# Patient Record
Sex: Female | Born: 1960 | Race: White | Hispanic: No | Smoking: Never smoker
Health system: Southern US, Community
[De-identification: ages and names within clinical notes are randomized; demographics above are authoritative.]

## PROBLEM LIST (undated history)

## (undated) DIAGNOSIS — R002 Palpitations: Secondary | ICD-10-CM

## (undated) DIAGNOSIS — K219 Gastro-esophageal reflux disease without esophagitis: Secondary | ICD-10-CM

## (undated) DIAGNOSIS — I10 Essential (primary) hypertension: Secondary | ICD-10-CM

## (undated) DIAGNOSIS — E785 Hyperlipidemia, unspecified: Secondary | ICD-10-CM

## (undated) HISTORY — DX: Gastro-esophageal reflux disease without esophagitis: K21.9

## (undated) HISTORY — DX: Hyperlipidemia, unspecified: E78.5

## (undated) HISTORY — PX: OVARIAN CYST SURGERY: SHX726

## (undated) HISTORY — DX: Palpitations: R00.2

---

## 1998-08-18 ENCOUNTER — Other Ambulatory Visit: Admission: RE | Admit: 1998-08-18 | Discharge: 1998-08-18 | Payer: Self-pay | Admitting: *Deleted

## 1999-06-09 ENCOUNTER — Emergency Department (HOSPITAL_COMMUNITY): Admission: EM | Admit: 1999-06-09 | Discharge: 1999-06-09 | Payer: Self-pay | Admitting: Emergency Medicine

## 1999-06-09 ENCOUNTER — Encounter: Payer: Self-pay | Admitting: Emergency Medicine

## 2000-05-24 ENCOUNTER — Ambulatory Visit (HOSPITAL_COMMUNITY): Admission: RE | Admit: 2000-05-24 | Discharge: 2000-05-24 | Payer: Self-pay | Admitting: Gastroenterology

## 2000-06-19 ENCOUNTER — Ambulatory Visit (HOSPITAL_BASED_OUTPATIENT_CLINIC_OR_DEPARTMENT_OTHER): Admission: RE | Admit: 2000-06-19 | Discharge: 2000-06-19 | Payer: Self-pay | Admitting: General Surgery

## 2001-07-02 ENCOUNTER — Other Ambulatory Visit: Admission: RE | Admit: 2001-07-02 | Discharge: 2001-07-02 | Payer: Self-pay | Admitting: Obstetrics & Gynecology

## 2001-08-24 ENCOUNTER — Ambulatory Visit (HOSPITAL_COMMUNITY): Admission: RE | Admit: 2001-08-24 | Discharge: 2001-08-24 | Payer: Self-pay | Admitting: Obstetrics & Gynecology

## 2001-11-16 ENCOUNTER — Ambulatory Visit (HOSPITAL_COMMUNITY): Admission: RE | Admit: 2001-11-16 | Discharge: 2001-11-16 | Payer: Self-pay | Admitting: Family Medicine

## 2002-11-27 ENCOUNTER — Other Ambulatory Visit: Admission: RE | Admit: 2002-11-27 | Discharge: 2002-11-27 | Payer: Self-pay | Admitting: Obstetrics & Gynecology

## 2003-04-30 ENCOUNTER — Ambulatory Visit (HOSPITAL_COMMUNITY): Admission: RE | Admit: 2003-04-30 | Discharge: 2003-04-30 | Payer: Self-pay | Admitting: Obstetrics & Gynecology

## 2004-11-29 ENCOUNTER — Emergency Department (HOSPITAL_COMMUNITY): Admission: EM | Admit: 2004-11-29 | Discharge: 2004-11-29 | Payer: Self-pay | Admitting: Emergency Medicine

## 2011-04-19 HISTORY — PX: COLONOSCOPY: SHX5424

## 2013-08-28 ENCOUNTER — Other Ambulatory Visit: Payer: Self-pay | Admitting: Family Medicine

## 2013-08-28 DIAGNOSIS — R110 Nausea: Secondary | ICD-10-CM

## 2013-08-28 DIAGNOSIS — R1011 Right upper quadrant pain: Secondary | ICD-10-CM

## 2013-08-28 DIAGNOSIS — R140 Abdominal distension (gaseous): Secondary | ICD-10-CM

## 2013-08-30 ENCOUNTER — Ambulatory Visit
Admission: RE | Admit: 2013-08-30 | Discharge: 2013-08-30 | Disposition: A | Discharge status: Home / Self Care | Payer: BC Managed Care – PPO | Source: Ambulatory Visit | Attending: Family Medicine | Admitting: Family Medicine

## 2013-08-30 DIAGNOSIS — R1011 Right upper quadrant pain: Secondary | ICD-10-CM

## 2013-08-30 DIAGNOSIS — R140 Abdominal distension (gaseous): Secondary | ICD-10-CM

## 2013-08-30 DIAGNOSIS — R110 Nausea: Secondary | ICD-10-CM

## 2014-10-08 ENCOUNTER — Other Ambulatory Visit: Payer: Self-pay | Admitting: Family Medicine

## 2014-10-08 DIAGNOSIS — R140 Abdominal distension (gaseous): Secondary | ICD-10-CM

## 2014-10-08 DIAGNOSIS — R1030 Lower abdominal pain, unspecified: Secondary | ICD-10-CM

## 2014-10-10 ENCOUNTER — Ambulatory Visit
Admission: RE | Admit: 2014-10-10 | Discharge: 2014-10-10 | Disposition: A | Discharge status: Home / Self Care | Payer: BLUE CROSS/BLUE SHIELD | Source: Ambulatory Visit | Attending: Family Medicine | Admitting: Family Medicine

## 2014-10-10 DIAGNOSIS — R1030 Lower abdominal pain, unspecified: Secondary | ICD-10-CM

## 2014-10-10 DIAGNOSIS — R140 Abdominal distension (gaseous): Secondary | ICD-10-CM

## 2015-05-19 IMAGING — US US ABDOMEN COMPLETE
1 series · 14 of 25 positions shown · non-contrast
Comparison: None.

CLINICAL DATA: Right upper quadrant abdominal pain. Nausea,
bloating.

EXAM:
ULTRASOUND ABDOMEN COMPLETE

[Series 1: us abdomen complete · 0.26mm/px · 14 of 75 slices shown]
[im 1/75]
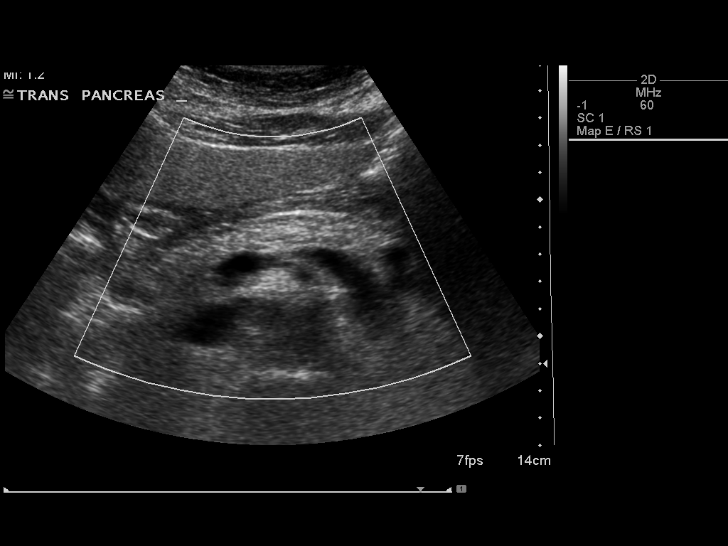
[im 7/75]
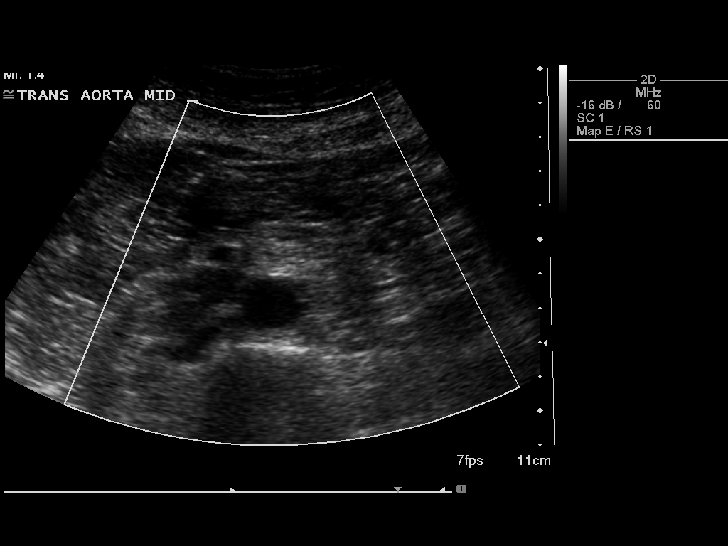
[im 13/75]
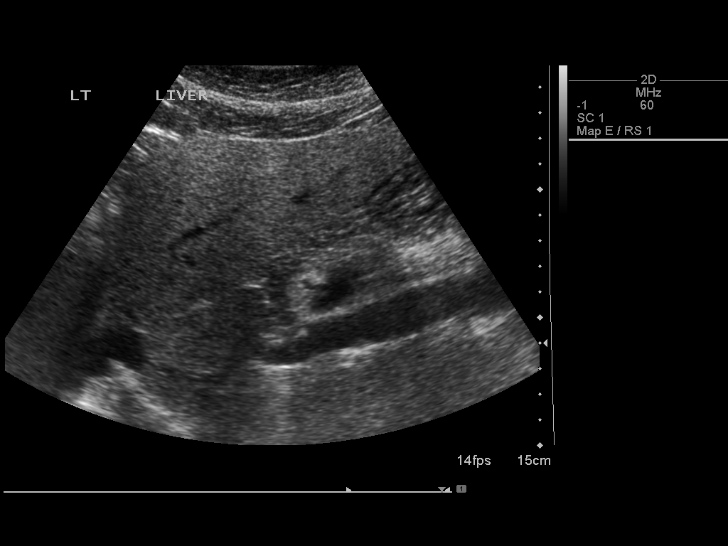
[im 19/75]
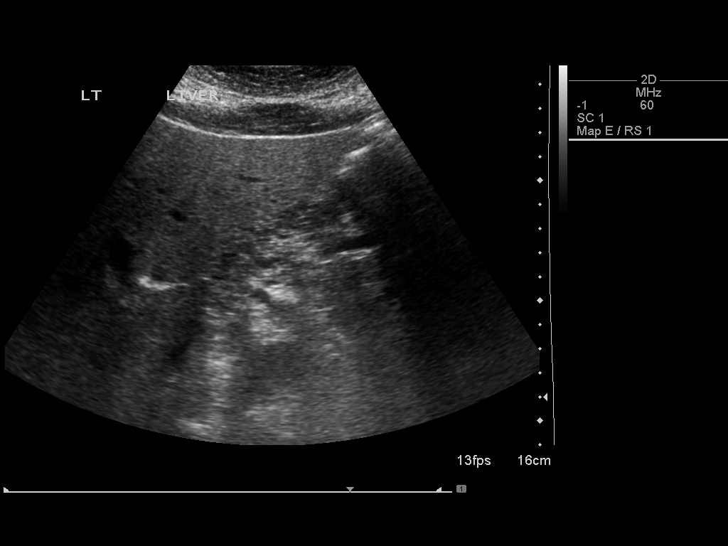
[im 25/75]
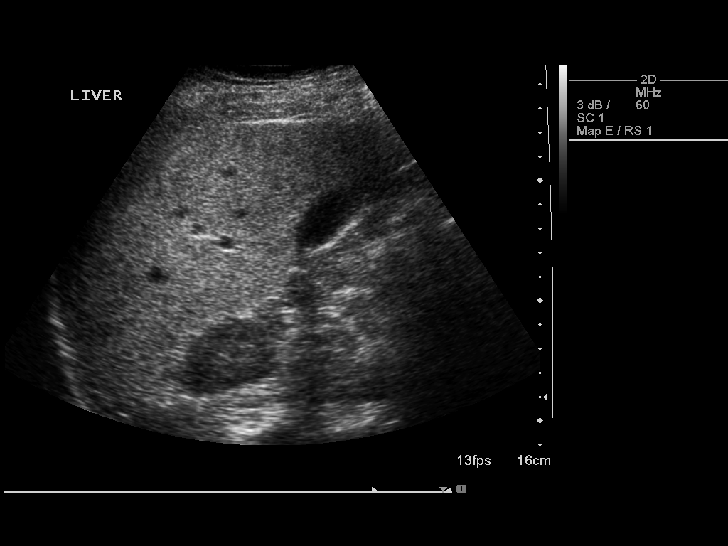
[im 28/75]
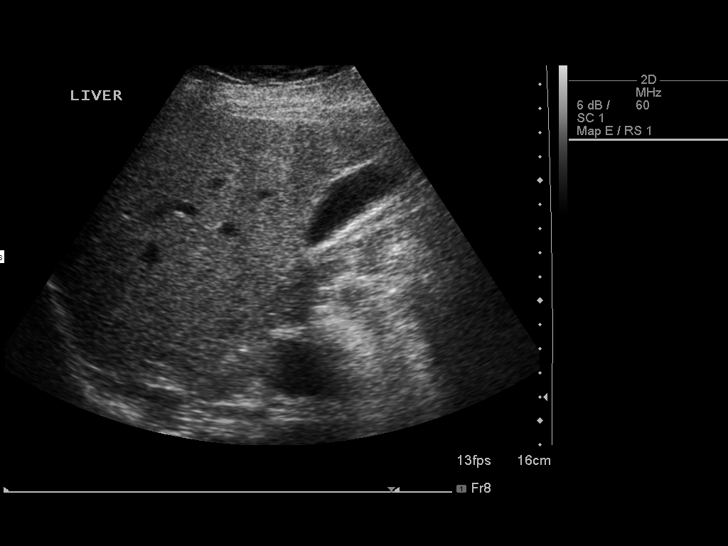
[im 34/75]
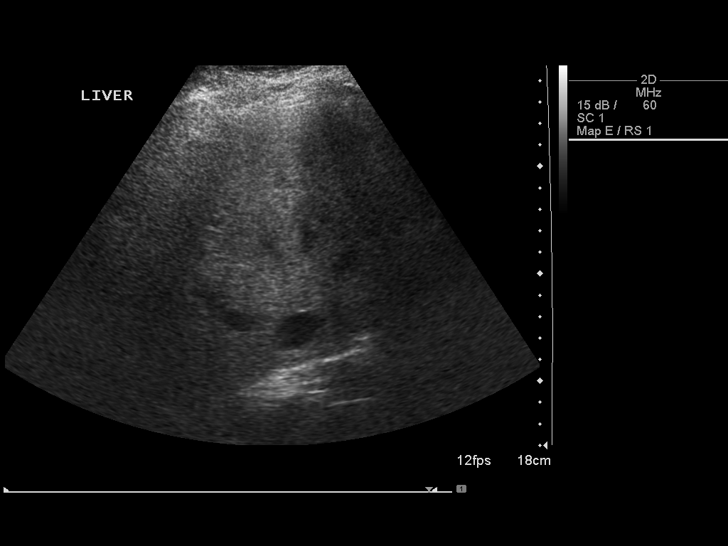
[im 41/75]
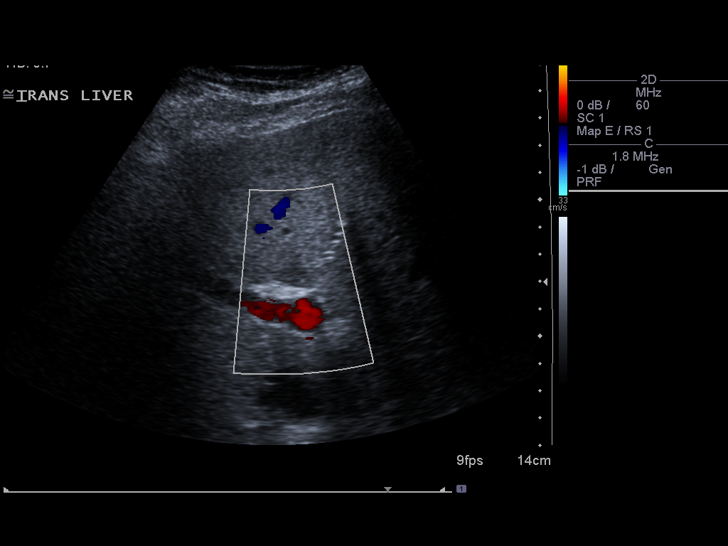
[im 47/75]
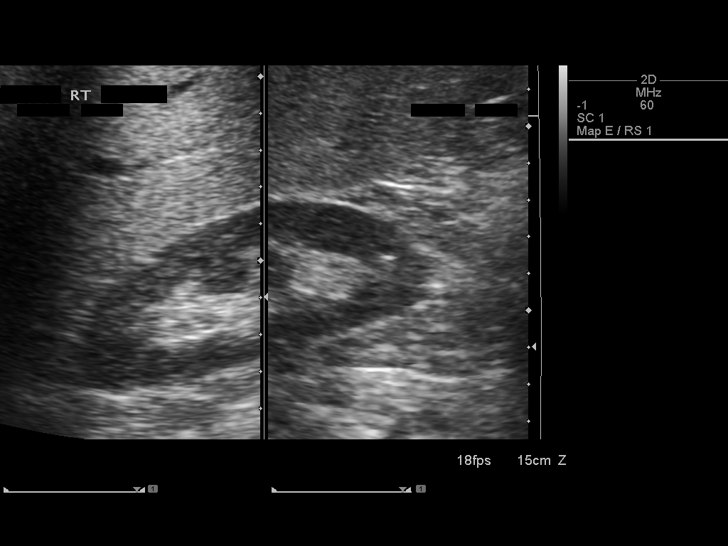
[im 50/75]
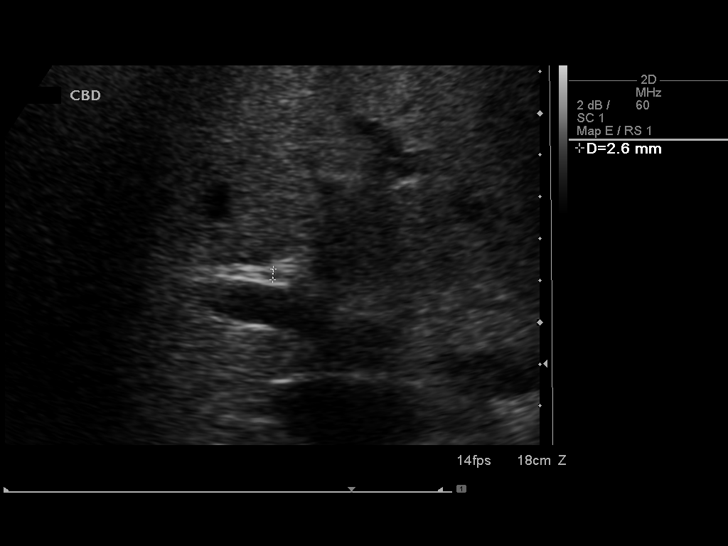
[im 56/75]
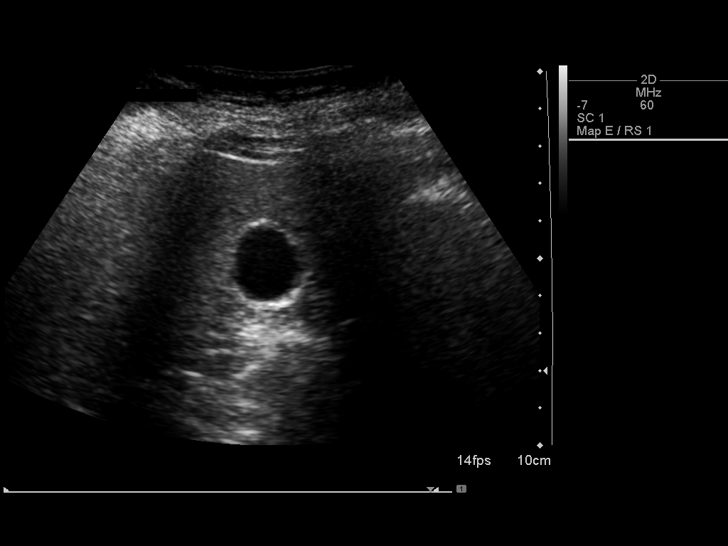
[im 62/75]
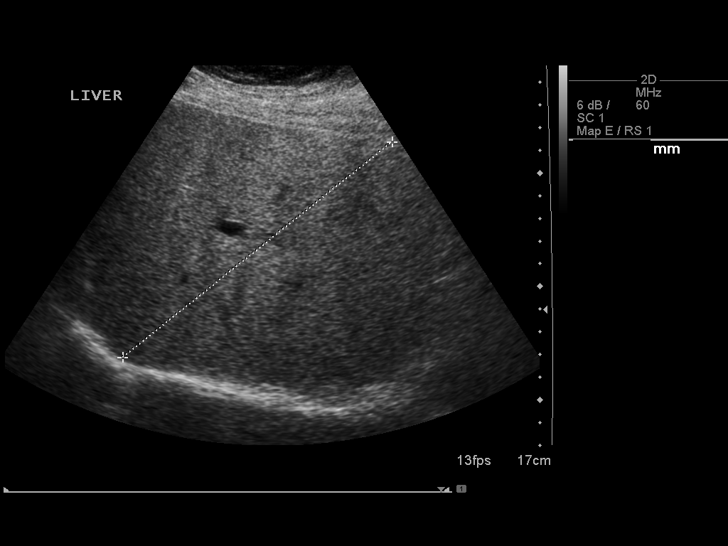
[im 68/75]
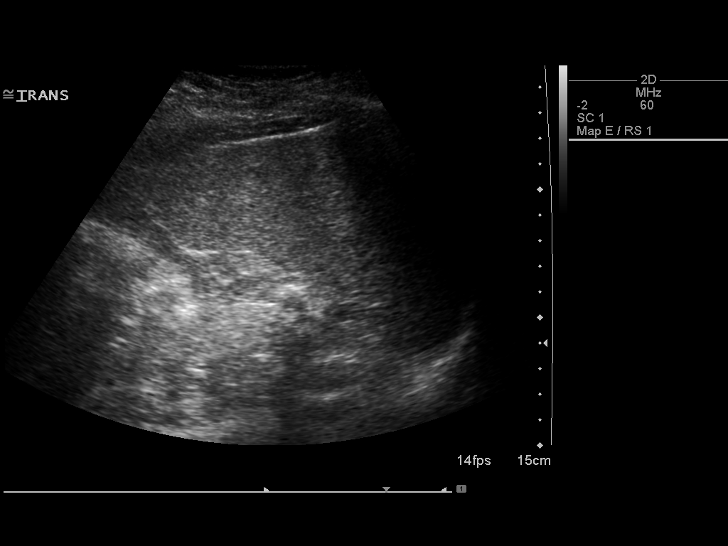
[im 75/75]
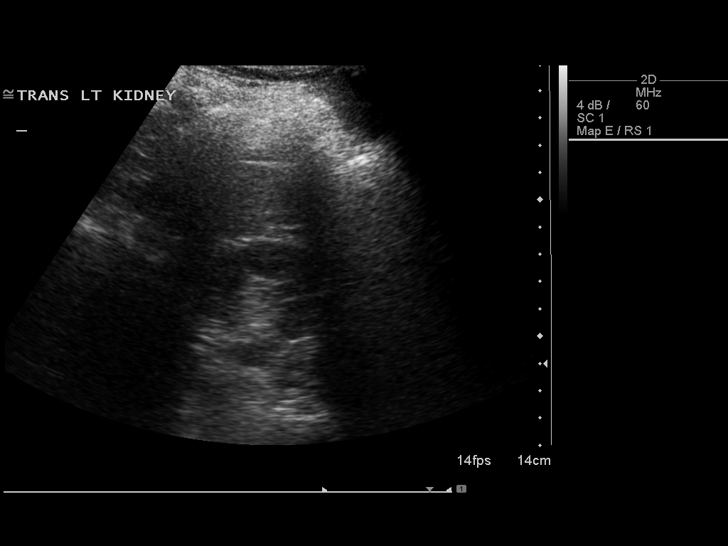

[14 of 25 positions shown; findings below may reference images not displayed]

FINDINGS: Gallbladder:

No gallstones or wall thickening visualized. No sonographic Murphy
sign noted.

Common bile duct:

Diameter: Normal caliber, 3 mm

Liver:

Increased echotexture throughout the liver suggesting fatty
infiltration. Area of hypo echogenicity adjacent to the gallbladder
fossa likely represents focal fatty sparing.

IVC:

No abnormality visualized.

Pancreas:

Visualized portion unremarkable.

Spleen:

Size and appearance within normal limits.

Right Kidney:

Length: 10.0 cm. Echogenicity within normal limits. No mass or
hydronephrosis visualized.

Left Kidney:

Length: 11.8 cm. Echogenicity within normal limits. No mass or
hydronephrosis visualized.

Abdominal aorta:

No aneurysm visualized.

Other findings:

None.
IMPRESSION: Fatty infiltration of the liver.  No acute findings.

## 2016-04-18 HISTORY — PX: UPPER GI ENDOSCOPY: SHX6162

## 2016-06-02 ENCOUNTER — Other Ambulatory Visit: Payer: Self-pay | Admitting: Obstetrics & Gynecology

## 2016-06-17 ENCOUNTER — Ambulatory Visit (HOSPITAL_COMMUNITY)
Admission: RE | Admit: 2016-06-17 | Payer: BLUE CROSS/BLUE SHIELD | Source: Ambulatory Visit | Admitting: Obstetrics & Gynecology

## 2016-06-17 ENCOUNTER — Encounter (HOSPITAL_COMMUNITY): Admission: RE | Payer: Self-pay | Source: Ambulatory Visit

## 2016-06-17 SURGERY — ROBOTIC ASSISTED TOTAL HYSTERECTOMY WITH SALPINGECTOMY
Anesthesia: General

## 2016-09-16 ENCOUNTER — Encounter: Payer: Self-pay | Admitting: Obstetrics & Gynecology

## 2017-09-18 ENCOUNTER — Encounter: Payer: Self-pay | Admitting: Obstetrics & Gynecology

## 2018-09-19 ENCOUNTER — Emergency Department (HOSPITAL_COMMUNITY): Payer: 59

## 2018-09-19 ENCOUNTER — Other Ambulatory Visit: Payer: Self-pay

## 2018-09-19 ENCOUNTER — Encounter (HOSPITAL_COMMUNITY): Payer: Self-pay | Admitting: Emergency Medicine

## 2018-09-19 ENCOUNTER — Emergency Department (HOSPITAL_COMMUNITY)
Admission: EM | Admit: 2018-09-19 | Discharge: 2018-09-19 | Disposition: A | Discharge status: Home / Self Care | Payer: 59 | Attending: Emergency Medicine | Admitting: Emergency Medicine

## 2018-09-19 DIAGNOSIS — R5383 Other fatigue: Secondary | ICD-10-CM | POA: Diagnosis not present

## 2018-09-19 DIAGNOSIS — R42 Dizziness and giddiness: Secondary | ICD-10-CM

## 2018-09-19 DIAGNOSIS — I10 Essential (primary) hypertension: Secondary | ICD-10-CM | POA: Insufficient documentation

## 2018-09-19 DIAGNOSIS — R002 Palpitations: Secondary | ICD-10-CM | POA: Insufficient documentation

## 2018-09-19 HISTORY — DX: Essential (primary) hypertension: I10

## 2018-09-19 LAB — CBC
HCT: 45.6 % (ref 36.0–46.0)
Hemoglobin: 14.8 g/dL (ref 12.0–15.0)
MCH: 31.3 pg (ref 26.0–34.0)
MCHC: 32.5 g/dL (ref 30.0–36.0)
MCV: 96.4 fL (ref 80.0–100.0)
Platelets: 132 10*3/uL — ABNORMAL LOW (ref 150–400)
RBC: 4.73 MIL/uL (ref 3.87–5.11)
RDW: 11.9 % (ref 11.5–15.5)
WBC: 3.9 10*3/uL — ABNORMAL LOW (ref 4.0–10.5)
nRBC: 0 % (ref 0.0–0.2)

## 2018-09-19 LAB — BASIC METABOLIC PANEL
Anion gap: 6 (ref 5–15)
BUN: 15 mg/dL (ref 6–20)
CO2: 26 mmol/L (ref 22–32)
Calcium: 9.6 mg/dL (ref 8.9–10.3)
Chloride: 106 mmol/L (ref 98–111)
Creatinine, Ser: 1.02 mg/dL — ABNORMAL HIGH (ref 0.44–1.00)
GFR calc Af Amer: >60 mL/min (ref >60)
GFR calc non Af Amer: >60 mL/min (ref >60)
Glucose, Bld: 92 mg/dL (ref 70–99)
Potassium: 4 mmol/L (ref 3.5–5.1)
Sodium: 138 mmol/L (ref 135–145)

## 2018-09-19 LAB — TSH: TSH: 0.907 u[IU]/mL (ref 0.350–4.500)

## 2018-09-19 LAB — D-DIMER, QUANTITATIVE: D-Dimer, Quant: <0.27 ug/mL-FEU (ref 0.00–0.50)

## 2018-09-19 LAB — TROPONIN I: Troponin I: <0.03 ng/mL (ref <0.03)

## 2018-09-19 MED ORDER — SODIUM CHLORIDE 0.9% FLUSH
3.0000 mL | Freq: Once | INTRAVENOUS | Status: AC
  Administered 2018-09-19: 3 mL via INTRAVENOUS

## 2018-09-19 MED ORDER — SODIUM CHLORIDE 0.9 % IV BOLUS
1000.0000 mL | Freq: Once | INTRAVENOUS | Status: AC
  Administered 2018-09-19: 1000 mL via INTRAVENOUS

## 2018-09-19 NOTE — ED Provider Notes (Signed)
[facility] DEPT Provider Note   CSN: [id number] Arrival date & time: 09/19/18  0941    History   Chief Complaint Chief Complaint  Patient presents with  . Palpitations    HPI Rachel Mccarty is a 58 y.o. female who presents with palpitations. PMH significant for HTN, GERD, poss IBS. She states that for the past week she's had constant palpitations like her heart is "thumping" with associated shortness of breath, fatigue, and lightheadedness. She has had these symptoms intermittently before but it's never been this persistent and usually will resolve quickly. She has never had a work up for it before. She cannot identify any specific triggers. She denies excessive caffeine use and has stopped drinking coffee to see if it helps but it has not. She denies any energy drinks, supplements, or illegal drug use. No fever, headache, syncope or near syncope, cough, abdominal pain, N/V, leg swelling.      HPI  Past Medical History:  Diagnosis Date  . Hypertension     There are no active problems to display for this patient.   History reviewed. No pertinent surgical history.   OB History   No obstetric history on file.      Home Medications    Prior to Admission medications   Not on File    Family History No family history on file.  Social History Social History   Tobacco Use  . Smoking status: Never Smoker  . Smokeless tobacco: Never Used  Substance Use Topics  . Alcohol use: Yes  . Drug use: Never     Allergies   Patient has no known allergies.   Review of Systems Review of Systems  Constitutional: Positive for fatigue. Negative for fever.  Respiratory: Positive for shortness of breath.   Cardiovascular: Positive for palpitations. Negative for chest pain and leg swelling.  Gastrointestinal: Negative for abdominal pain, nausea and vomiting.  Neurological: Positive for light-headedness. Negative for syncope.  All other systems  reviewed and are negative.    Physical Exam Updated Vital Signs BP (!) 141/99 (BP Location: Left Arm)   Pulse (!) 113   Temp 98.2 F (36.8 C) (Oral)   Ht 5\' 5"  (1.651 m)   Wt 79.4 kg   SpO2 100%   BMI 29.12 kg/m   Physical Exam Vitals signs and nursing note reviewed.  Constitutional:      General: She is not in acute distress.    Appearance: Normal appearance. She is well-developed. She is not ill-appearing.     Comments: Calm, cooperative. Pleasant  HENT:     Head: Normocephalic and atraumatic.  Eyes:     General: No scleral icterus.       Right eye: No discharge.        Left eye: No discharge.     Conjunctiva/sclera: Conjunctivae normal.     Pupils: Pupils are equal, round, and reactive to light.  Neck:     Musculoskeletal: Normal range of motion.  Cardiovascular:     Rate and Rhythm: Normal rate and regular rhythm.  Pulmonary:     Effort: Pulmonary effort is normal. No respiratory distress.     Breath sounds: Normal breath sounds.  Abdominal:     General: There is no distension.     Palpations: Abdomen is soft.     Tenderness: There is no abdominal tenderness.  Musculoskeletal:     Right lower leg: No edema.     Left lower leg: No edema.  Skin:  General: Skin is warm and dry.  Neurological:     Mental Status: She is alert and oriented to person, place, and time.  Psychiatric:        Behavior: Behavior normal.      ED Treatments / Results  Labs (all labs ordered are listed, but only abnormal results are displayed) Labs Reviewed  BASIC METABOLIC PANEL - Abnormal; Notable for the following components:      Result Value   Creatinine, Ser 1.02 (*)    All other components within normal limits  CBC - Abnormal; Notable for the following components:   WBC 3.9 (*)    Platelets 132 (*)    All other components within normal limits  TROPONIN I  TSH  D-DIMER, QUANTITATIVE (NOT AT [facility])    EKG EKG Interpretation  Date/Time:  Wednesday September 19 2018  09:52:58 EDT Ventricular Rate:  117 PR Interval:    QRS Duration: 85 QT Interval:  325 QTC Calculation: 454 R Axis:   13 Text Interpretation:  Sinus tachycardia Low voltage, precordial leads No acute changes No significant change since last tracing [id number] No significant change since last tracing Confirmed by Varney Biles [phone]) on 09/19/2018 11:58:12 AM   Radiology Dg Chest 2 View  Result Date: 09/19/2018 CLINICAL DATA:  Heart palpitations 1.5 weeks with chest pain EXAM: CHEST - 2 VIEW COMPARISON:  None available FINDINGS: Normal heart size and mediastinal contours. No acute infiltrate or edema. No effusion or pneumothorax. Exaggerated thoracic kyphosis. No acute osseous findings. IMPRESSION: Negative chest. Electronically Signed   By: Monte Fantasia M.D.   On: 09/19/2018 10:25    Procedures Procedures (including critical care time)  Medications Ordered in ED Medications  sodium chloride flush (NS) 0.9 % injection 3 mL (3 mLs Intravenous Given 09/19/18 1055)  sodium chloride 0.9 % bolus 1,000 mL (0 mLs Intravenous Stopped 09/19/18 1413)     Initial Impression / Assessment and Plan / ED Course  I have reviewed the triage vital signs and the nursing notes.  Pertinent labs & imaging results that were available during my care of the patient were reviewed by me and considered in my medical decision making (see chart for details).  58 year old female presents with palpitations, SOB, lightheadedness, fatigue. Symptoms have been ongoing for a week. Unclear etiology. She is mildly tachycardic but otherwise vitals are normal. Exam is unremarkable. EKG is sinus tachycardia. Will CXR, labs, TSH, D-dimer.   CBC is normal. BMP is normal. TSH is normal. D-dimer is negative. Trop is normal. Do not think she needs a repeat since this was ordered in triage per CP order set and she is not having symptoms consistent with ACS. She was given 1L fluid and feels better. HR is improved to mid 80s. She has an  upcoming appt with Cardiology in mid June. She was advised to drink plenty of fluids. She is comfortable with plan.   Final Clinical Impressions(s) / ED Diagnoses   Final diagnoses:  Palpitations  Lightheaded  Fatigue, unspecified type    ED Discharge Orders    None       Recardo Evangelist, PA-C 09/19/18 [place], MD 09/20/18 [phone]

## 2018-09-19 NOTE — Discharge Instructions (Signed)
Please drink plenty of fluids and follow up with your doctor Return if worsening

## 2018-09-19 NOTE — ED Triage Notes (Signed)
Pt complaint of palpitations for past week and a half. "sometimes left chest pain;" denies at present "but weak and lightheaded."

## 2018-09-20 ENCOUNTER — Ambulatory Visit: Payer: 59

## 2018-09-20 ENCOUNTER — Encounter: Payer: Self-pay | Admitting: Cardiology

## 2018-09-20 ENCOUNTER — Ambulatory Visit: Payer: 59 | Admitting: Cardiology

## 2018-09-20 VITALS — BP 136/83 | HR 108 | Temp 97.8°F | Ht 65.0 in | Wt 180.0 lb

## 2018-09-20 DIAGNOSIS — R000 Tachycardia, unspecified: Secondary | ICD-10-CM | POA: Diagnosis not present

## 2018-09-20 DIAGNOSIS — I10 Essential (primary) hypertension: Secondary | ICD-10-CM | POA: Diagnosis not present

## 2018-09-20 DIAGNOSIS — E782 Mixed hyperlipidemia: Secondary | ICD-10-CM

## 2018-09-20 DIAGNOSIS — R002 Palpitations: Secondary | ICD-10-CM | POA: Insufficient documentation

## 2018-09-20 MED ORDER — ATENOLOL 50 MG PO TABS: 50.0000 mg | ORAL_TABLET | Freq: Two times a day (BID) | ORAL | 1 refills | Status: DC

## 2018-09-20 NOTE — Progress Notes (Signed)
Primary Physician:  Aletha Halim., PA-C   Patient ID: Rachel Mccarty, female    DOB: April 04, 1961, 58 y.o.   MRN: [id number]  Subjective:    Chief Complaint  Patient presents with  . Palpitations    palpitations 2 weeks, SOB  . New Patient (Initial Visit)    HPI: Rachel Mccarty  is a 58 y.o. female  with hypertension, hyperlipidemia, history of elevated liver enzymes (now normal), presents today for evaluation of palpitations.   She was seen in ER on 06/03 for persistent palpitations that are associated with shortness of breath, fatigue, and lightheadedness. She has previously had palpitations, but not persistent like she has had this past week. Workup in the ER was unyielding. Negative troponin, CXR was normal, EKG revealed sinus tachycardia. HR improved with IVF. She was advised outpatient follow up. She called our office today asking to be seen on urgent basis as she was again having worsening palpitations and felt short of breath.   States that her heart rate has been elevated, persistently in the low 100's. Denies any chest pain or shortness of breath. No syncope. She was previously walking regularly that she tolerated well. Lipids were noted to be elevated at her last physical and she is working to make diet changes.   Maternal grandmother had MI at age 44. Mother died at age 46 from sudden cardiac death. No former tobacco use. She does drink one beer per day, but has not drank any alcohol over the last week.   Past Medical History:  Diagnosis Date  . Hypertension   . Palpitations     Past Surgical History:  Procedure Laterality Date  . OVARIAN CYST SURGERY      Social History   Socioeconomic History  . Marital status: Married    Spouse name: Not on file  . Number of children: 2  . Years of education: Not on file  . Highest education level: Not on file  Occupational History  . Not on file  Social Needs  . Financial resource strain: Not on file  . Food  insecurity:    Worry: Not on file    Inability: Not on file  . Transportation needs:    Medical: Not on file    Non-medical: Not on file  Tobacco Use  . Smoking status: Never Smoker  . Smokeless tobacco: Never Used  Substance and Sexual Activity  . Alcohol use: Yes    Alcohol/week: 1.0 standard drinks    Types: 1 Cans of beer per week    Comment: 1 drink per day  . Drug use: Never  . Sexual activity: Not on file  Lifestyle  . Physical activity:    Days per week: Not on file    Minutes per session: Not on file  . Stress: Not on file  Relationships  . Social connections:    Talks on phone: Not on file    Gets together: Not on file    Attends religious service: Not on file    Active member of club or organization: Not on file    Attends meetings of clubs or organizations: Not on file    Relationship status: Not on file  . Intimate partner violence:    Fear of current or ex partner: Not on file    Emotionally abused: Not on file    Physically abused: Not on file    Forced sexual activity: Not on file  Other Topics Concern  . Not on  file  Social History Narrative  . Not on file    Review of Systems  Constitution: Negative for decreased appetite, malaise/fatigue, weight gain and weight loss.  Eyes: Negative for visual disturbance.  Cardiovascular: Positive for leg swelling (varicose veins) and palpitations. Negative for chest pain, claudication, dyspnea on exertion, orthopnea and syncope.  Respiratory: Negative for hemoptysis and wheezing.   Endocrine: Negative for cold intolerance and heat intolerance.  Hematologic/Lymphatic: Does not bruise/bleed easily.  Skin: Negative for nail changes.  Musculoskeletal: Negative for muscle weakness and myalgias.  Gastrointestinal: Negative for abdominal pain, change in bowel habit, nausea and vomiting.  Neurological: Positive for dizziness. Negative for difficulty with concentration, focal weakness and headaches.   Psychiatric/Behavioral: Negative for altered mental status and suicidal ideas.  All other systems reviewed and are negative.     Objective:  Blood pressure 136/83, pulse (!) 108, temperature 97.8 F (36.6 C), height '5\' 5"'  (1.651 m), weight 180 lb (81.6 kg), SpO2 95 %. Body mass index is 29.95 kg/m.    Physical Exam  Constitutional: She is oriented to person, place, and time. Vital signs are normal. She appears well-developed and well-nourished.  HENT:  Head: Normocephalic and atraumatic.  Neck: Normal range of motion.  Cardiovascular: Normal rate, regular rhythm, normal heart sounds and intact distal pulses.  Pulmonary/Chest: Effort normal and breath sounds normal. No accessory muscle usage. No respiratory distress.  Abdominal: Soft. Bowel sounds are normal.  Musculoskeletal: Normal range of motion.  Neurological: She is alert and oriented to person, place, and time.  Skin: Skin is warm and dry.  Vitals reviewed.  Radiology: Dg Chest 2 View  Result Date: 09/19/2018 CLINICAL DATA:  Heart palpitations 1.5 weeks with chest pain EXAM: CHEST - 2 VIEW COMPARISON:  None available FINDINGS: Normal heart size and mediastinal contours. No acute infiltrate or edema. No effusion or pneumothorax. Exaggerated thoracic kyphosis. No acute osseous findings. IMPRESSION: Negative chest. Electronically Signed   By: Monte Fantasia M.D.   On: 09/19/2018 10:25    Laboratory examination:   06/28/2018: Cholesterol 228, triglycerides 125, HDL 56, LDL 157. K 4.0, Creatinine 1.05, eGFR 59, normal liver enzymes, CMP normal.  CMP Latest Ref Rng & Units 09/19/2018  Glucose 70 - 99 mg/dL 92  BUN 6 - 20 mg/dL 15  Creatinine 0.44 - 1.00 mg/dL 1.02(H)  Sodium 135 - 145 mmol/L 138  Potassium 3.5 - 5.1 mmol/L 4.0  Chloride 98 - 111 mmol/L 106  CO2 22 - 32 mmol/L 26  Calcium 8.9 - 10.3 mg/dL 9.6   CBC Latest Ref Rng & Units 09/19/2018  WBC 4.0 - 10.5 K/uL 3.9(L)  Hemoglobin 12.0 - 15.0 g/dL 14.8  Hematocrit 36.0  - 46.0 % 45.6  Platelets 150 - 400 K/uL 132(L)   Lipid Panel  No results found for: CHOL, TRIG, HDL, CHOLHDL, VLDL, LDLCALC, LDLDIRECT HEMOGLOBIN A1C No results found for: HGBA1C, MPG TSH Recent Labs    09/19/18 1057  TSH 0.907    PRN Meds:. Medications Discontinued During This Encounter  Medication Reason  . lisinopril (ZESTRIL) 20 MG tablet Discontinued by provider   Current Meds  Medication Sig  . Multiple Vitamin tablet Take 1 tablet by mouth daily.  Marland Kitchen omeprazole (PRILOSEC) 40 MG capsule Take 40 mg by mouth daily.  . [DISCONTINUED] lisinopril (ZESTRIL) 20 MG tablet Take 20 mg by mouth daily.    Cardiac Studies:     Assessment:   Sinus tachycardia - Plan: EKG 12-Lead, Holter monitor - 48 hour, PCV ECHOCARDIOGRAM  COMPLETE  Essential hypertension - Plan: Holter monitor - 48 hour, PCV ECHOCARDIOGRAM COMPLETE  Mixed hyperlipidemia  EKG 09/20/2018: Sinus tachycardia at 101 bpm, normal axis, no evidence of ischemia.   Recommendations:   Patient noted to have sinus tachycardia on EKG with occasional PVC/PAC on exam. She is without chest pain or dyspnea on exertion. She is having associated shortness of breath, dizziness, and fatigue with persistent tachycardia. Unsure of etiology for sudden onset of tachycardia. EKG workup was reassuring. Troponin negative and D-Dimer negative. Physical exam is unremarkable. Although TSH was normal, could have subclinical hyperthyroidism. I will discontinue her lisinopril and change to atenolol 50 mg BID. Will place her on 48 hour holter monitor for further evaluation. Will also obtain echocardiogram to exclude any structural abnormalities. She is working to make diet changes to control her lipids, would recommend reevaluation in the next few months. I will see her back in 6 weeks for follow up or sooner if needed.   *I have discussed this case with Dr. Einar Gip and he personally examined the patient and participated in formulating the plan.*     Miquel Dunn, MSN, APRN, FNP-C [facility] [place] Office: [phone] Fax: [phone]

## 2018-09-24 ENCOUNTER — Telehealth: Payer: Self-pay

## 2018-09-24 NOTE — Telephone Encounter (Signed)
Pt had a reaction to the Atenolol, severe diarreah and SOB. Can you recommend something else.

## 2018-09-24 NOTE — Telephone Encounter (Signed)
Unsure if related to medication, would recommend holding it for a few days and rechallenge to see if it reoccurs again. She can even try taking half of the previous dose when she rechallenges to see how she does.

## 2018-09-24 NOTE — Telephone Encounter (Signed)
Pt agreed and will let us know how she does.

## 2018-09-25 DIAGNOSIS — R002 Palpitations: Secondary | ICD-10-CM

## 2018-09-26 ENCOUNTER — Telehealth: Payer: Self-pay

## 2018-09-26 NOTE — Telephone Encounter (Signed)
Can we get a copy of that? She needs nuclear stress test

## 2018-09-26 NOTE — Telephone Encounter (Signed)
On your desk

## 2018-09-26 NOTE — Telephone Encounter (Signed)
Rachel Mccarty from [facility] called with report: 17 beat run of v-tac @ 164 BPM

## 2018-09-27 NOTE — Telephone Encounter (Signed)
LVM to return call to discuss holter report

## 2018-09-28 ENCOUNTER — Other Ambulatory Visit: Payer: Self-pay | Admitting: Cardiology

## 2018-10-01 ENCOUNTER — Ambulatory Visit: Payer: Self-pay | Admitting: Cardiology

## 2018-10-02 ENCOUNTER — Other Ambulatory Visit: Payer: Self-pay | Admitting: Cardiology

## 2018-10-02 DIAGNOSIS — I472 Ventricular tachycardia: Secondary | ICD-10-CM

## 2018-10-02 DIAGNOSIS — I4729 Other ventricular tachycardia: Secondary | ICD-10-CM

## 2018-10-02 NOTE — Progress Notes (Signed)
Called patient and discussed holter report findings with 17 beats of NSVT. I have recommended nuclear stress testing for further evaluation. She is now taking a half a tablet of atenolol and is feeling some better. Does report some heart racing episodes on occasion and also episodes of feeling very fatigued and short of breath.

## 2018-10-08 ENCOUNTER — Ambulatory Visit (INDEPENDENT_AMBULATORY_CARE_PROVIDER_SITE_OTHER): Payer: 59

## 2018-10-08 ENCOUNTER — Other Ambulatory Visit: Payer: Self-pay

## 2018-10-08 DIAGNOSIS — I472 Ventricular tachycardia: Secondary | ICD-10-CM

## 2018-10-08 DIAGNOSIS — I4729 Other ventricular tachycardia: Secondary | ICD-10-CM

## 2018-10-10 ENCOUNTER — Telehealth: Payer: Self-pay | Admitting: Cardiology

## 2018-10-10 NOTE — Telephone Encounter (Signed)
LVM making patient aware of low risk stress test. Will further discuss at follow up. Continue with echo as scheduled.

## 2018-10-23 ENCOUNTER — Ambulatory Visit (INDEPENDENT_AMBULATORY_CARE_PROVIDER_SITE_OTHER): Payer: 59

## 2018-10-23 ENCOUNTER — Other Ambulatory Visit: Payer: Self-pay

## 2018-10-23 DIAGNOSIS — R000 Tachycardia, unspecified: Secondary | ICD-10-CM | POA: Diagnosis not present

## 2018-10-23 DIAGNOSIS — I10 Essential (primary) hypertension: Secondary | ICD-10-CM

## 2018-10-30 ENCOUNTER — Ambulatory Visit (INDEPENDENT_AMBULATORY_CARE_PROVIDER_SITE_OTHER): Payer: 59 | Admitting: Cardiology

## 2018-10-30 ENCOUNTER — Other Ambulatory Visit: Payer: Self-pay

## 2018-10-30 ENCOUNTER — Encounter: Payer: Self-pay | Admitting: Cardiology

## 2018-10-30 VITALS — Ht 65.0 in | Wt 165.0 lb

## 2018-10-30 DIAGNOSIS — R000 Tachycardia, unspecified: Secondary | ICD-10-CM | POA: Diagnosis not present

## 2018-10-30 DIAGNOSIS — I10 Essential (primary) hypertension: Secondary | ICD-10-CM | POA: Diagnosis not present

## 2018-10-30 DIAGNOSIS — I4729 Other ventricular tachycardia: Secondary | ICD-10-CM

## 2018-10-30 DIAGNOSIS — E782 Mixed hyperlipidemia: Secondary | ICD-10-CM

## 2018-10-30 DIAGNOSIS — I472 Ventricular tachycardia: Secondary | ICD-10-CM

## 2018-10-30 MED ORDER — ATENOLOL 25 MG PO TABS: 25.0000 mg | ORAL_TABLET | Freq: Two times a day (BID) | ORAL | 0 refills | Status: DC

## 2018-10-30 NOTE — Progress Notes (Signed)
Primary Physician:  Aletha Halim., PA-C   Patient ID: Rachel Mccarty, female    DOB: 03-Aug-1960, 58 y.o.   MRN: [id number]  Subjective:    Chief Complaint  Patient presents with  . Tachycardia  . Follow-up    echo results   This visit type was conducted due to national recommendations for restrictions regarding the COVID-19 Pandemic (e.g. social distancing).  This format is felt to be most appropriate for this patient at this time.  All issues noted in this document were discussed and addressed.  No physical exam was performed (except for noted visual exam findings with Telehealth visits).  The patient has consented to conduct a Telehealth visit and understands insurance will be billed.   I discussed the limitations of evaluation and management by telemedicine and the availability of in person appointments. The patient expressed understanding and agreed to proceed.  Virtual Visit via Video Note is as below  I connected with Ms. Finau, on 10/30/18 at 0900 by a video enabled telemedicine application and verified that I am speaking with the correct person using two identifiers.     I have discussed with her regarding the safety during COVID Pandemic and steps and precautions including social distancing with the patient.    HPI: Rachel Mccarty  is a 58 y.o. female  with hypertension, hyperlipidemia, history of elevated liver enzymes (now normal),recently seen for sinus tachycardia and palpitations.  She was seen in ER on 06/03 for persistent palpitations that are associated with shortness of breath, fatigue, and lightheadedness. She has previously had palpitations, but not persistent. Workup in the ER was unyielding. She was started on atenolol and was placed on 48 hour holter monitor that revealed sinus tachycardia and 17 beats of NSVT. She then was scheduled for lexiscan nuclear stress test and echocardiogram and now presents for follow up.  Since bing on atenolol, heart rate and  palpitations have improved.    Denies any chest pain or shortness of breath. No syncope. She was previously walking regularly that she tolerated well. Lipids were noted to be elevated at her last physical and she is working to make diet changes.   Maternal grandmother had MI at age 20. Mother died at age 45 from sudden cardiac death. No former tobacco use. She does drink one beer per day, but has not drank any alcohol over the last week.   Past Medical History:  Diagnosis Date  . Hypertension   . Palpitations     Past Surgical History:  Procedure Laterality Date  . OVARIAN CYST SURGERY      Social History   Socioeconomic History  . Marital status: Married    Spouse name: Not on file  . Number of children: 2  . Years of education: Not on file  . Highest education level: Not on file  Occupational History  . Not on file  Social Needs  . Financial resource strain: Not on file  . Food insecurity    Worry: Not on file    Inability: Not on file  . Transportation needs    Medical: Not on file    Non-medical: Not on file  Tobacco Use  . Smoking status: Never Smoker  . Smokeless tobacco: Never Used  Substance and Sexual Activity  . Alcohol use: Yes    Alcohol/week: 1.0 standard drinks    Types: 1 Cans of beer per week    Comment: 1 drink per day  . Drug use: Never  .  Sexual activity: Not on file  Lifestyle  . Physical activity    Days per week: Not on file    Minutes per session: Not on file  . Stress: Not on file  Relationships  . Social Herbalist on phone: Not on file    Gets together: Not on file    Attends religious service: Not on file    Active member of club or organization: Not on file    Attends meetings of clubs or organizations: Not on file    Relationship status: Not on file  . Intimate partner violence    Fear of current or ex partner: Not on file    Emotionally abused: Not on file    Physically abused: Not on file    Forced sexual activity:  Not on file  Other Topics Concern  . Not on file  Social History Narrative  . Not on file    Review of Systems  Constitution: Negative for decreased appetite, malaise/fatigue, weight gain and weight loss.  Eyes: Negative for visual disturbance.  Cardiovascular: Positive for leg swelling (varicose veins). Negative for chest pain, claudication, dyspnea on exertion, orthopnea, palpitations and syncope.  Respiratory: Negative for hemoptysis and wheezing.   Endocrine: Negative for cold intolerance and heat intolerance.  Hematologic/Lymphatic: Does not bruise/bleed easily.  Skin: Negative for nail changes.  Musculoskeletal: Negative for muscle weakness and myalgias.  Gastrointestinal: Negative for abdominal pain, change in bowel habit, nausea and vomiting.  Neurological: Negative for difficulty with concentration, dizziness, focal weakness and headaches.  Psychiatric/Behavioral: Negative for altered mental status and suicidal ideas.  All other systems reviewed and are negative.     Objective:  Height '5\' 5"'  (1.651 m), weight 165 lb (74.8 kg). Body mass index is 27.46 kg/m.    Physical exam not performed or limited due to virtual visit.  Patient appeared to be in no distress, Neck was supple, respiration was not labored.  Please see exam details from prior visit is as below.   Physical Exam  Constitutional: She is oriented to person, place, and time. Vital signs are normal. She appears well-developed and well-nourished.  HENT:  Head: Normocephalic and atraumatic.  Neck: Normal range of motion.  Cardiovascular: Normal rate, regular rhythm, normal heart sounds and intact distal pulses.  Pulmonary/Chest: Effort normal and breath sounds normal. No accessory muscle usage. No respiratory distress.  Abdominal: Soft. Bowel sounds are normal.  Musculoskeletal: Normal range of motion.  Neurological: She is alert and oriented to person, place, and time.  Skin: Skin is warm and dry.  Vitals  reviewed.  Radiology: No results found.  Laboratory examination:   06/28/2018: Cholesterol 228, triglycerides 125, HDL 56, LDL 157. K 4.0, Creatinine 1.05, eGFR 59, normal liver enzymes, CMP normal.   CMP Latest Ref Rng & Units 09/19/2018  Glucose 70 - 99 mg/dL 92  BUN 6 - 20 mg/dL 15  Creatinine 0.44 - 1.00 mg/dL 1.02(H)  Sodium 135 - 145 mmol/L 138  Potassium 3.5 - 5.1 mmol/L 4.0  Chloride 98 - 111 mmol/L 106  CO2 22 - 32 mmol/L 26  Calcium 8.9 - 10.3 mg/dL 9.6   CBC Latest Ref Rng & Units 09/19/2018  WBC 4.0 - 10.5 K/uL 3.9(L)  Hemoglobin 12.0 - 15.0 g/dL 14.8  Hematocrit 36.0 - 46.0 % 45.6  Platelets 150 - 400 K/uL 132(L)   Lipid Panel  No results found for: CHOL, TRIG, HDL, CHOLHDL, VLDL, LDLCALC, LDLDIRECT HEMOGLOBIN A1C No results found for: HGBA1C,  MPG TSH Recent Labs    09/19/18 1057  TSH 0.907    PRN Meds:. There are no discontinued medications. Current Meds  Medication Sig  . atenolol (TENORMIN) 50 MG tablet TAKE 1 TABLET BY MOUTH TWICE A DAY  . Multiple Vitamin tablet Take 1 tablet by mouth daily.  Marland Kitchen omeprazole (PRILOSEC) 40 MG capsule Take 40 mg by mouth daily.    Cardiac Studies:   Echocardiogram 10/23/2018: Normal LV systolic function with EF 56%. Left ventricle cavity is normal in size. Normal left ventricular wall thickness. Normal global wall motion. Normal diastolic filling pattern. Trileaflet aortic valve. Mild (Grade I) aortic regurgitation. Moderate (Grade II) mitral regurgitation. Mild tricuspid regurgitation. Estimated pulmonary artery systolic pressure is 31 mmHg.   Lexiscan Sestamibi Stress Test 10/08/2018: Stress EKG is non-diagnostic, as this is pharmacological stress test. Myocardial pefusion imaging is normal. All segments of left ventricle demonstrated normal wall motion and thickening.  Stress LV EF is calculated at 44% but visually appears normal.   Low risk study.  48 hr holter 09/20/2018: NSR/sinus tachycardia. 9 hrs 47 mins of  tachycardia. Rare PAC and PVC with rare atrial couplet. Symptoms of shortness of breath, rapid HR correlated with 1 PVC and sinus tachycardia. Also had symptoms of shortness of breath with NSR. 2 episodes of atrial tachycardia without reported symptoms. 1 episode of NSVT for 17 beats on D2 at 1452 without symptoms. No A fib or SVT was noted.   Assessment:     ICD-10-CM   1. NSVT (nonsustained ventricular tachycardia) (HCC)  I47.2 Thyroid Profile  2. Sinus tachycardia  R00.0 Thyroid Profile  3. Essential hypertension  I10   4. Mixed hyperlipidemia  E78.2     EKG 09/20/2018: Sinus tachycardia at 101 bpm, normal axis, no evidence of ischemia.   Recommendations:   Patient is presently doing well, has had improvement in palpitations, dizziness, and shortness of breath since being on atenolol.  She did have 2 decrease her dose of atenolol down to 25 mg twice daily due to worsening shortness of breath, but since doing this has felt much better.  She does continue to have some days of worsening palpitations.  Had asymptomatic NSVT noted on event monitor.  Has had negative nuclear stress test and essentially normal echocardiogram.  Test results were discussed with the patient.  I discussed etiology for NSVT and is likely self-limiting.  Would recommend continuing with beta-blocker therapy.    She does mention worsening leg swelling since being on the medicine.  I do not suspect is related to medication as generally this is not a side effect of beta-blockers; however, if symptoms continue can consider changing to different medication.  I have ordered full thyroid panel to exclude any hyperthyroidism as etiology for her sudden onset of sinus tachycardia and palpitations.  She does have markedly elevated lipids that will need reevaluation in the next few months with her PCP.  Would like to see her back in 2 months for follow-up.  She did not have a way to check her blood pressure or heart rate today, will  schedule for next follow-up appointment to be in office for further evaluation.    Miquel Dunn, MSN, APRN, FNP-C [facility]. [place] Office: [phone] Fax: [phone]

## 2018-12-17 ENCOUNTER — Other Ambulatory Visit: Payer: Self-pay | Admitting: Cardiology

## 2018-12-18 LAB — THYROID PANEL
Free Thyroxine Index: 1.8 (ref 1.2–4.9)
T3 Uptake Ratio: 25 % (ref 24–39)
T4, Total: 7.3 ug/dL (ref 4.5–12.0)

## 2018-12-20 NOTE — Progress Notes (Signed)
Lvm for pt to call back. 

## 2018-12-20 NOTE — Progress Notes (Signed)
Thyroid profile within normal limits

## 2019-01-03 ENCOUNTER — Encounter: Payer: Self-pay | Admitting: Cardiology

## 2019-01-03 ENCOUNTER — Ambulatory Visit (INDEPENDENT_AMBULATORY_CARE_PROVIDER_SITE_OTHER): Payer: 59 | Admitting: Cardiology

## 2019-01-03 ENCOUNTER — Other Ambulatory Visit: Payer: Self-pay

## 2019-01-03 VITALS — BP 130/88 | HR 70 | Ht 65.0 in | Wt 182.0 lb

## 2019-01-03 DIAGNOSIS — I10 Essential (primary) hypertension: Secondary | ICD-10-CM

## 2019-01-03 DIAGNOSIS — I472 Ventricular tachycardia: Secondary | ICD-10-CM

## 2019-01-03 DIAGNOSIS — I4729 Other ventricular tachycardia: Secondary | ICD-10-CM

## 2019-01-03 DIAGNOSIS — E782 Mixed hyperlipidemia: Secondary | ICD-10-CM

## 2019-01-03 DIAGNOSIS — R000 Tachycardia, unspecified: Secondary | ICD-10-CM

## 2019-01-03 DIAGNOSIS — I340 Nonrheumatic mitral (valve) insufficiency: Secondary | ICD-10-CM

## 2019-01-03 NOTE — Progress Notes (Signed)
Primary Physician:  Aletha Halim., PA-C   Patient ID: Rachel Mccarty, female    DOB: [birth year], 58 y.o.   MRN: [id number]  Subjective:    No chief complaint on file.   HPI: Rachel Mccarty  is a 58 y.o. female  with hypertension, hyperlipidemia, history of elevated liver enzymes (now normal),recently seen for sinus tachycardia and palpitations.  She was seen in ER on 06/03 for persistent palpitations that are associated with shortness of breath, fatigue, and lightheadedness. She has previously had palpitations, but not persistent. Workup in the ER was unyielding. She was started on atenolol and was placed on 48 hour holter monitor that revealed sinus tachycardia and 17 beats of NSVT.  Underwent Lexiscan nuclear stress test on 10/08/2018 that was considered low risk study.  Echocardiogram also performed at that time showed normal LVEF with moderate MR.    Since bing on atenolol, heart rate and palpitations have improved.  She now presents for 85-monthfollow-up.  Symptoms have continued to improve and she is feeling much better.  No complaints today.   Denies any chest pain or shortness of breath. No syncope.  She has been making diet changes and has also started regular exercise that she is tolerating well.  She has not had lipids rechecked.  Maternal grandmother had MI at age 58 Mother died at age 1247from sudden cardiac death. No former tobacco use. She does drink one beer per day, but has not drank any alcohol over the last week.   Past Medical History:  Diagnosis Date  . Hypertension   . Palpitations     Past Surgical History:  Procedure Laterality Date  . OVARIAN CYST SURGERY      Social History   Socioeconomic History  . Marital status: Married    Spouse name: Not on file  . Number of children: 2  . Years of education: Not on file  . Highest education level: Not on file  Occupational History  . Not on file  Social Needs  . Financial resource strain: Not on file   . Food insecurity    Worry: Not on file    Inability: Not on file  . Transportation needs    Medical: Not on file    Non-medical: Not on file  Tobacco Use  . Smoking status: Never Smoker  . Smokeless tobacco: Never Used  Substance and Sexual Activity  . Alcohol use: Yes    Alcohol/week: 1.0 standard drinks    Types: 1 Cans of beer per week    Comment: 1 drink per day  . Drug use: Never  . Sexual activity: Not on file  Lifestyle  . Physical activity    Days per week: Not on file    Minutes per session: Not on file  . Stress: Not on file  Relationships  . Social c[place]bal[place] phone: Not on file    Gets together: Not on file    Attends religious service: Not on file    Active member of club or organization: Not on file    Attends meetings of clubs or organizations: Not on file    Relationship status: Not on file  . Intimate partner violence    Fear of current or ex partner: Not on file    Emotionally abused: Not on file    Physically abused: Not on file    Forced sexual activity: Not on file  Other Topics Concern  . Not on  file  Social History Narrative  . Not on file    Review of Systems  Constitution: Negative for decreased appetite, malaise/fatigue, weight gain and weight loss.  Eyes: Negative for visual disturbance.  Cardiovascular: Positive for leg swelling (varicose veins). Negative for chest pain, claudication, dyspnea on exertion, orthopnea, palpitations and syncope.  Respiratory: Negative for hemoptysis and wheezing.   Endocrine: Negative for cold intolerance and heat intolerance.  Hematologic/Lymphatic: Does not bruise/bleed easily.  Skin: Negative for nail changes.  Musculoskeletal: Negative for muscle weakness and myalgias.  Gastrointestinal: Negative for abdominal pain, change in bowel habit, nausea and vomiting.  Neurological: Negative for difficulty with concentration, dizziness, focal weakness and headaches.  Psychiatric/Behavioral: Negative  for altered mental status and suicidal ideas.  All other systems reviewed and are negative.     Objective:  Blood pressure 130/88, pulse 70, height '5\' 5"'$  (1.651 m), weight 182 lb (82.6 kg), SpO2 98 %. Body mass index is 30.29 kg/m.   Physical Exam  Constitutional: She is oriented to person, place, and time. Vital signs are normal. She appears well-developed and well-nourished.  HENT:  Head: Normocephalic and atraumatic.  Neck: Normal range of motion.  Cardiovascular: Normal rate, regular rhythm, normal heart sounds and intact distal pulses.  Pulmonary/Chest: Effort normal and breath sounds normal. No accessory muscle usage. No respiratory distress.  Abdominal: Soft. Bowel sounds are normal.  Musculoskeletal: Normal range of motion.  Neurological: She is alert and oriented to person, place, and time.  Skin: Skin is warm and dry.  Vitals reviewed.  Radiology: No results found.  Laboratory examination:   06/28/2018: Cholesterol 228, triglycerides 125, HDL 56, LDL 157. K 4.0, Creatinine 1.05, eGFR 59, normal liver enzymes, CMP normal.   CMP Latest Ref Rng & Units 09/19/2018  Glucose 70 - 99 mg/dL 92  BUN 6 - 20 mg/dL 15  Creatinine 0.44 - 1.00 mg/dL 1.02(H)  Sodium 135 - 145 mmol/L 138  Potassium 3.5 - 5.1 mmol/L 4.0  Chloride 98 - 111 mmol/L 106  CO2 22 - 32 mmol/L 26  Calcium 8.9 - 10.3 mg/dL 9.6   CBC Latest Ref Rng & Units 09/19/2018  WBC 4.0 - 10.5 K/uL 3.9(L)  Hemoglobin 12.0 - 15.0 g/dL 14.8  Hematocrit 36.0 - 46.0 % 45.6  Platelets 150 - 400 K/uL 132(L)   Lipid Panel     Component Value Date/Time   CHOL 203 (H) 01/04/2019 1031   TRIG 120 01/04/2019 1031   HDL 51 01/04/2019 1031   CHOLHDL 4.0 01/04/2019 1031   HEMOGLOBIN A1C No results found for: HGBA1C, MPG TSH Recent Labs    09/19/18 1057  TSH 0.907    PRN Meds:. There are no discontinued medications. Current Meds  Medication Sig  . atenolol (TENORMIN) 25 MG tablet Take 1 tablet (25 mg total) by  mouth 2 (two) times daily. (Patient taking differently: Take 25 mg by mouth daily. )  . Multiple Vitamin tablet Take 1 tablet by mouth daily.  Marland Kitchen omeprazole (PRILOSEC) 40 MG capsule Take 40 mg by mouth daily.    Cardiac Studies:   Echocardiogram 10/23/2018: Normal LV systolic function with EF 56%. Left ventricle cavity is normal in size. Normal left ventricular wall thickness. Normal global wall motion. Normal diastolic filling pattern. Trileaflet aortic valve. Mild (Grade I) aortic regurgitation. Moderate (Grade II) mitral regurgitation. Mild tricuspid regurgitation. Estimated pulmonary artery systolic pressure is 31 mmHg.   Lexiscan Sestamibi Stress Test 10/08/2018: Stress EKG is non-diagnostic, as this is pharmacological stress test. Myocardial  pefusion imaging is normal. All segments of left ventricle demonstrated normal wall motion and thickening.  Stress LV EF is calculated at 44% but visually appears normal.   Low risk study.  48 hr holter 09/20/2018: NSR/sinus tachycardia. 9 hrs 47 mins of tachycardia. Rare PAC and PVC with rare atrial couplet. Symptoms of shortness of breath, rapid HR correlated with 1 PVC and sinus tachycardia. Also had symptoms of shortness of breath with NSR. 2 episodes of atrial tachycardia without reported symptoms. 1 episode of NSVT for 17 beats on D2 at 1452 without symptoms. No A fib or SVT was noted.   Assessment:     ICD-10-CM   1. Sinus tachycardia  R00.0   2. Essential hypertension  I10   3. NSVT (nonsustained ventricular tachycardia) (HCC)  I47.2   4. Moderate mitral regurgitation  I34.0   5. Mixed hyperlipidemia  E78.2 Lipid Profile    EKG 09/20/2018: Sinus tachycardia at 101 bpm, normal axis, no evidence of ischemia.   Recommendations:   Palpitations and sinus tachycardia have significantly improved and essentially resolved since being on atenolol, Mccarty continue the same.  Previously noted leg swelling has also resolved.  She has been  exercising daily with walking 2 miles per day and tolerates this well.  She has had negative nuclear stress test.  She did have moderate MR by echocardiogram, Mccarty need continued surveillance of this.    She has not had repeat lipids.  Her lipids in March were noted to be markedly elevated, hopefully with making lifestyle changes, this Mccarty have improved.  I Mccarty repeat her lipids and notify her of the results.  If continue to be elevated, Mccarty start statin therapy at that time.  Would like to see her back in approximately 1 year for follow-up.    Miquel Dunn, MSN, APRN, FNP-C [facility]. [place] Office: [phone] Fax: [phone]

## 2019-01-05 LAB — LIPID PANEL
Chol/HDL Ratio: 4 ratio (ref 0.0–4.4)
Cholesterol, Total: 203 mg/dL — ABNORMAL HIGH (ref 100–199)
HDL: 51 mg/dL (ref >39)
LDL Chol Calc (NIH): 131 mg/dL — ABNORMAL HIGH (ref 0–99)
Triglycerides: 120 mg/dL (ref 0–149)
VLDL Cholesterol Cal: 21 mg/dL (ref 5–40)

## 2019-01-08 ENCOUNTER — Encounter: Payer: Self-pay | Admitting: Cardiology

## 2019-01-11 MED ORDER — SIMVASTATIN 20 MG PO TABS: 20.0000 mg | ORAL_TABLET | Freq: Every day | ORAL | 2 refills | Status: DC

## 2019-01-11 NOTE — Progress Notes (Signed)
Called pt to inform her about her lab results. Pt understood and agrees to start on a low dose chol med.

## 2019-01-11 NOTE — Progress Notes (Signed)
Added simvastatin 20 mg daily at night. Check lipids and liver enzymes in 8 weeks

## 2019-01-11 NOTE — Progress Notes (Signed)
Called pt to inform her about her chol medication. Pt understood.

## 2019-01-11 NOTE — Addendum Note (Signed)
Addended by: Gwinda Maine on: 01/11/2019 11:33 AM   Modules accepted: Orders

## 2019-01-26 ENCOUNTER — Other Ambulatory Visit: Payer: Self-pay | Admitting: Cardiology

## 2019-04-29 ENCOUNTER — Other Ambulatory Visit: Payer: Self-pay | Admitting: Cardiology

## 2019-07-31 ENCOUNTER — Other Ambulatory Visit: Payer: Self-pay | Admitting: *Deleted

## 2019-07-31 DIAGNOSIS — I83893 Varicose veins of bilateral lower extremities with other complications: Secondary | ICD-10-CM

## 2019-08-08 ENCOUNTER — Encounter (HOSPITAL_COMMUNITY): Payer: 59

## 2019-08-30 ENCOUNTER — Telehealth (HOSPITAL_COMMUNITY): Payer: Self-pay

## 2019-08-30 NOTE — Telephone Encounter (Signed)

## 2019-09-02 ENCOUNTER — Ambulatory Visit (HOSPITAL_COMMUNITY)
Admission: RE | Admit: 2019-09-02 | Discharge: 2019-09-02 | Disposition: A | Discharge status: Home / Self Care | Payer: 59 | Source: Ambulatory Visit | Attending: Vascular Surgery | Admitting: Vascular Surgery

## 2019-09-02 ENCOUNTER — Other Ambulatory Visit: Payer: Self-pay

## 2019-09-02 ENCOUNTER — Ambulatory Visit: Payer: 59 | Admitting: Physician Assistant

## 2019-09-02 VITALS — BP 143/91 | HR 69 | Temp 97.9°F | Resp 16 | Ht 65.0 in | Wt 175.0 lb

## 2019-09-02 DIAGNOSIS — I83899 Varicose veins of unspecified lower extremities with other complications: Secondary | ICD-10-CM

## 2019-09-02 DIAGNOSIS — I83893 Varicose veins of bilateral lower extremities with other complications: Secondary | ICD-10-CM | POA: Diagnosis not present

## 2019-09-02 DIAGNOSIS — I872 Venous insufficiency (chronic) (peripheral): Secondary | ICD-10-CM | POA: Diagnosis not present

## 2019-09-02 NOTE — Progress Notes (Signed)
Requested by:  Aletha Halim., PA-C [address]  [place] [address]  Reason for consultation: lower extremity edema    History of Present Illness   Rachel Mccarty is a 59 y.o. (Jul 14, 1960) female who presents for evaluation of bilateral ankle swelling and spider veins. She is in excellent health without chronic medical problems and is not diabetic. She does not use tobacco. She is on no blood thinning medications  Venous symptoms include: Left posterior knee aching. Negative heavy, tired, throbbing, burning, itching, swelling, bleeding, ulcer  Onset/duration:   20 to 30 years, swelling has worsened over the past 6 to 12 months Occupation: Works sitting at a desk all day Aggravating factors: sitting Alleviating factors: elevation; improves when not sitting for long periods Compression: Has not worn  Pain medications:  none Previous vein procedures:  Sclerotherapy 20+ years ago History of DVT: no   Past Medical History:  Diagnosis Date  . Hypertension   . Palpitations     Past Surgical History:  Procedure Laterality Date  . OVARIAN CYST SURGERY      Social History   Socioeconomic History  . Marital status: Married    Spouse name: Not on file  . Number of children: 2  . Years of education: Not on file  . Highest education level: Not on file  Occupational History  . Not on file  Tobacco Use  . Smoking status: Never Smoker  . Smokeless tobacco: Never Used  Substance and Sexual Activity  . Alcohol use: Yes    Alcohol/week: 1.0 standard drinks    Types: 1 Cans of beer per week    Comment: 1 drink per day  . Drug use: Never  . Sexual activity: Not on file  Other Topics Concern  . Not on file  Social History Narrative  . Not on file   Social Determinants of Health   Financial Resource Strain:   . Difficulty of Paying Living Expenses:   Food Insecurity:   . Worried About Charity fundraiser in the Last Year:   . Arboriculturist in the Last  Year:   Transportation Needs:   . Film/video editor (Medical):   Marland Kitchen Lack of Transportation (Non-Medical):   Physical Activity:   . Days of Exercise per Week:   . Minutes of Exercise per Session:   Stress:   . Feeling of Stress :   Social Connections:   . Frequency of Communication with Friends and Family:   . Frequency of Social Gatherings with Friends and Family:   . Attends Religious Services:   . Active Member of Clubs or Organizations:   . Attends Archivist Meetings:   Marland Kitchen Marital Status:   Intimate Partner Violence:   . Fear of Current or Ex-Partner:   . Emotionally Abused:   Marland Kitchen Physically Abused:   . Sexually Abused:     Family History  Problem Relation Age of Onset  . Sudden Cardiac Death Mother   . Dementia Mother   . High blood pressure Father   . Heart attack Maternal Grandmother   . Stroke Maternal Grandfather     Current Outpatient Medications  Medication Sig Dispense Refill  . atenolol (TENORMIN) 25 MG tablet TAKE 1 TABLET BY MOUTH EVERY DAY 90 tablet 1  . Multiple Vitamin tablet Take 1 tablet by mouth daily.    Marland Kitchen omeprazole (PRILOSEC) 40 MG capsule Take 40 mg by mouth daily.    . simvastatin (  ZOCOR) 20 MG tablet Take 1 tablet (20 mg total) by mouth at bedtime. 30 tablet 2   No current facility-administered medications for this visit.    No Known Allergies  REVIEW OF SYSTEMS (negative unless checked):   Cardiac:  []  Chest pain or chest pressure? []  Shortness of breath upon activity? []  Shortness of breath when lying flat? []  Irregular heart rhythm?  Vascular:  []  Pain in calf, thigh, or hip brought on by walking? []  Pain in feet at night that wakes you up from your sleep? []  Blood clot in your veins? [x]  Leg swelling?  Pulmonary:  []  Oxygen at home? []  Productive cough? []  Wheezing?  Neurologic:  []  Sudden weakness in arms or legs? []  Sudden numbness in arms or legs? []  Sudden onset of difficult speaking or slurred speech? []   Temporary loss of vision in one eye? []  Problems with dizziness?  Gastrointestinal:  []  Blood in stool? []  Vomited blood?  Genitourinary:  []  Burning when urinating? []  Blood in urine?  Psychiatric:  []  Major depression  Hematologic:  []  Bleeding problems? []  Problems with blood clotting?  Dermatologic:  []  Rashes or ulcers?  Constitutional:  []  Fever or chills?  Ear/Nose/Throat:  []  Change in hearing? []  Nose bleeds? []  Sore throat?  Musculoskeletal:  []  Back pain? []  Joint pain? []  Muscle pain?   Physical Examination     Today's Vitals   09/02/19 1459  BP: (!) 143/91  Pulse: 69  Resp: 16  Temp: 97.9 F (36.6 C)  TempSrc: Temporal  SpO2: 98%  Weight: 175 lb (79.4 kg)  Height: 5\' 5"  (1.651 m)  PainSc: 6    Body mass index is 29.12 kg/m.  General:  WDWN in NAD; vital signs documented above Gait: No ataxia, walks unaided HENT: WNL, normocephalic Pulmonary: normal non-labored breathing , without Rales, rhonchi,  wheezing Cardiac: regular HR, without  Murmurs without carotid bruits Abdomen: soft, NT, no masses Skin: without rashes Vascular Exam/Pulses:  Right Left  Radial 2+ (normal) 2+ (normal)  Ulnar  not evaluated  not evaluated  Femoral 2+ (normal) 2+ (normal)  Popliteal  not palpable  not palpable  DP 2+ (normal) 2+ (normal)  PT 2+ (normal) 2+ (normal)   Extremities: without varicose veins, with reticular veins, without edema, without stasis pigmentation, without lipodermatosclerosis, without ulcers Musculoskeletal: no muscle wasting or atrophy  Neurologic: A&O X 3;  No focal weakness or paresthesias are detected Psychiatric:  The pt has Normal affect.      Non-invasive Vascular Imaging   BLE Venous Insufficiency Duplex (09/02/2019):  Bilateral:  - No evidence of deep vein thrombosis seen in the lower extremities,  bilaterally, from the common femoral through the popliteal veins.  - No evidence of superficial venous thrombosis in  the lower extremities,  bilaterally.    Right:  - No evidence of superficial venous reflux seen in the right greater  saphenous vein.  - No evidence of superficial venous reflux seen in the right short  saphenous vein.  - Venous reflux is noted in the right common femoral vein.    Left:  - No evidence of superficial venous reflux seen in the left short  saphenous vein.  - Venous reflux is noted in the left common femoral vein.  - Venous reflux is noted in the left sapheno-femoral junction.  - Venous reflux is noted in the left greater saphenous vein in the thigh.   Medical Decision Making   CEOLA HUGLEY is a 59 y.o. 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 Yes all her herself  Based on the patient's history and examination, I recommend: Continued exercise and daily elevation above the heart level..  I discussed with the patient the use of  20-30 mm thigh high compression stockings and need for 3 month trial of such.  Ultrasound examination reveals venous reflux of the left greater saphenous vein at the thigh level. The diameter of the vein ranges 4 to 5 mm in its length in this area. This may be amenable to endovascular ablation.  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 We discussed sclerotherapy and that insurance does not typically cover this treatment. We discussed out-of-pocket expenses and she is interested in referral to our sclerotherapist.  The patient will follow up in 3 months with Dr. Scot Dock or [facility] Alar.  Thank you for allowing [place] to participate in this patient's care.   Barbie Banner, PA-C [facility] Office: [phone]  09/02/2019, 2:34 PM  Clinic MD: Trula Slade

## 2019-09-18 ENCOUNTER — Ambulatory Visit: Payer: 59 | Admitting: Cardiology

## 2019-09-18 ENCOUNTER — Encounter: Payer: Self-pay | Admitting: Cardiology

## 2019-09-18 ENCOUNTER — Other Ambulatory Visit: Payer: Self-pay

## 2019-09-18 VITALS — Resp 17 | Ht 65.0 in | Wt 178.0 lb

## 2019-09-18 DIAGNOSIS — I472 Ventricular tachycardia: Secondary | ICD-10-CM

## 2019-09-18 DIAGNOSIS — I10 Essential (primary) hypertension: Secondary | ICD-10-CM

## 2019-09-18 DIAGNOSIS — I4729 Other ventricular tachycardia: Secondary | ICD-10-CM

## 2019-09-18 DIAGNOSIS — I340 Nonrheumatic mitral (valve) insufficiency: Secondary | ICD-10-CM

## 2019-09-18 DIAGNOSIS — E782 Mixed hyperlipidemia: Secondary | ICD-10-CM

## 2019-09-18 DIAGNOSIS — R002 Palpitations: Secondary | ICD-10-CM

## 2019-09-18 NOTE — Progress Notes (Signed)
  Follow up visit  Subjective:   Rachel Mccarty, female    DOB: 07/11/1960, 59 y.o.   MRN: [id number]   HPI  Chief Complaint  Patient presents with  . Mitral Regurgitation  . Tachycardia  . Follow-up    10 month    59 y.o. Caucasian female with hypertension, hyperlipidemia, mod MR, h/o NSVT.  Patient was last seen by Ashton Kelley, FNP in 12/2018. At that time, symptoms of palpitations had significantly improved on atenolol. However, more recently she has had recurrence of palpitations and lightheadedness symptoms. She has also had episodes of chest tightness associated with palpitations.   Current Outpatient Medications on File Prior to Visit  Medication Sig Dispense Refill  . ALPRAZolam (XANAX) 0.25 MG tablet Take 0.125-0.25 mg by mouth 2 (two) times daily as needed.    . atenolol (TENORMIN) 25 MG tablet TAKE 1 TABLET BY MOUTH EVERY DAY 90 tablet 1  . fluticasone (FLONASE) 50 MCG/ACT nasal spray Place 2 sprays into both nostrils daily.    . Multiple Vitamin tablet Take 1 tablet by mouth daily.    . omeprazole (PRILOSEC) 40 MG capsule Take 40 mg by mouth daily.     No current facility-administered medications on file prior to visit.    Cardiovascular & other pertient studies:  EKG 09/18/2019: Sinus rhythm 67 bpm Normal EKG  Echocardiogram 10/23/2018:  Normal LV systolic function with EF 56%. Left ventricle cavity is normal  in size. Normal left ventricular wall thickness. Normal global wall  motion. Normal diastolic filling pattern.  Trileaflet aortic valve. Mild (Grade I) aortic regurgitation.  Moderate (Grade II) mitral regurgitation.  Mild tricuspid regurgitation. Estimated pulmonary artery systolic pressure  is 31 mmHg.   Lexiscan Sestamibi Stress Test 10/08/2018: Stress EKG is non-diagnostic, as this is pharmacological stress test. Myocardial pefusion imaging is normal. All segments of left ventricle demonstrated normal wall motion and thickening.  Stress LV EF  is calculated at 44% but visually appears normal.   Low risk study.  Holter Monitor on 09/20/2018 for 48 hours:  NSR/sinus tachycardia. 9 hrs 47 mins of tachycardia. Rare PAC and PVC with rare atrial couplet. Symptoms of shortness of breath, rapid HR correlated with 1 PVC and sinus tachycardia. Also had symptoms of shortness of breath with NSR. 2 episodes of atrial tachycardia without reported symptoms. 1 episode of NSVT for 17 beats on D2 at 1452 without symptoms. No A fib or SVT was noted.   Recent labs: 6-12/2018: Glucose 92, BUN/Cr 15/1.02. EGFR >60. Na/K 138/4.0.  H/H 14/45. MCV 96. Platelets 132 HbA1C N/A Chol 203, TG 120, HDL 51, LDL 131 TSH 0.9 normal   Review of Systems  Cardiovascular: Positive for palpitations. Negative for chest pain, dyspnea on exertion, leg swelling and syncope.  Neurological: Positive for light-headedness.       Vitals:   09/18/19 1350 09/18/19 1351  Resp:    SpO2: 98% 99%   Orthostatic VS for the past 72 hrs (Last 3 readings):  Orthostatic BP Patient Position BP Location Cuff Size Orthostatic Pulse  09/18/19 1351 (!) 144/96 Standing Left Arm Normal 71  09/18/19 1350 (!) 143/97 Sitting Left Arm Normal 67  09/18/19 1345 128/83 Supine Left Arm Normal 67     Body mass index is 29.62 kg/m. Filed Weights   09/18/19 1345  Weight: 178 lb (80.7 kg)     Objective:   Physical Exam  Constitutional: No distress.  Neck: No JVD present.  Cardiovascular: Normal rate, regular rhythm, normal   heart sounds and intact distal pulses.  No murmur heard. Pulmonary/Chest: Effort normal and breath sounds normal. She has no wheezes. She has no rales.  Musculoskeletal:        General: No edema.  Nursing note and vitals reviewed.         Assessment & Recommendations:   59 y.o. Caucasian female with hypertension, hyperlipidemia, mod MR, h/o NSVT.  Mod MR: Clinically stable. Will repeat echocardiogram given her symptoms.  NSVT/palpitations: Repeat  event monitor.   Chest pain: No ischemia in stress test in 09/2018. Evaluate for above first.   Hypertension: Fairly well controlled.   F/u in 6 weeks   Tianne Plott Esther Hardy, MD [facility]. [facility] Pager: [phone] Office: [phone]

## 2019-09-24 ENCOUNTER — Ambulatory Visit: Payer: 59

## 2019-09-24 DIAGNOSIS — I4729 Other ventricular tachycardia: Secondary | ICD-10-CM

## 2019-09-24 DIAGNOSIS — R002 Palpitations: Secondary | ICD-10-CM

## 2019-09-24 DIAGNOSIS — I340 Nonrheumatic mitral (valve) insufficiency: Secondary | ICD-10-CM

## 2019-09-24 DIAGNOSIS — I472 Ventricular tachycardia: Secondary | ICD-10-CM

## 2019-10-11 ENCOUNTER — Ambulatory Visit: Payer: 59

## 2019-10-30 ENCOUNTER — Ambulatory Visit: Payer: 59 | Admitting: Cardiology

## 2019-10-30 ENCOUNTER — Other Ambulatory Visit: Payer: Self-pay

## 2019-10-30 ENCOUNTER — Encounter: Payer: Self-pay | Admitting: Cardiology

## 2019-10-30 VITALS — BP 139/88 | HR 65 | Ht 65.0 in | Wt 179.0 lb

## 2019-10-30 DIAGNOSIS — I472 Ventricular tachycardia: Secondary | ICD-10-CM

## 2019-10-30 DIAGNOSIS — I10 Essential (primary) hypertension: Secondary | ICD-10-CM

## 2019-10-30 DIAGNOSIS — I4729 Other ventricular tachycardia: Secondary | ICD-10-CM

## 2019-10-30 DIAGNOSIS — I340 Nonrheumatic mitral (valve) insufficiency: Secondary | ICD-10-CM

## 2019-10-30 MED ORDER — ATENOLOL 50 MG PO TABS: 50.0000 mg | ORAL_TABLET | Freq: Every day | ORAL | 3 refills | Status: DC

## 2019-10-30 NOTE — Progress Notes (Signed)
Follow up visit  Subjective:   Rachel Mccarty, female    DOB: 04/11/1961, 59 y.o.   MRN: [id number]   HPI  Chief Complaint  Patient presents with  . Moderate mitral regurgitation    results  . Follow-up    59 y.o. Caucasian female with hypertension, hyperlipidemia, mod MR, NSVT.  Palpitations have improved. Reviewed recent echocardiogram and event monitor with the patient.    Current Outpatient Medications on File Prior to Visit  Medication Sig Dispense Refill  . ALPRAZolam (XANAX) 0.25 MG tablet Take 0.125-0.25 mg by mouth 2 (two) times daily as needed.    Marland Kitchen atenolol (TENORMIN) 25 MG tablet TAKE 1 TABLET BY MOUTH EVERY DAY 90 tablet 1  . fluticasone (FLONASE) 50 MCG/ACT nasal spray Place 2 sprays into both nostrils daily.    . Multiple Vitamin tablet Take 1 tablet by mouth daily.    Marland Kitchen omeprazole (PRILOSEC) 40 MG capsule Take 40 mg by mouth daily.     No current facility-administered medications on file prior to visit.    Cardiovascular & other pertient studies:  Event monitor 09/24/2019 - 10/23/2019: Diagnostic time: 88%  Dominant rhythm: Sinus. HR 50-130 bpm. Avg HR 79 bpm. 1 auto detected event shows 7 beat NSVT (09/28/19 11:44 PM CST) Patient triggered events show sinus rhythm/ tachycardia No atrial fibrillation/atrial flutter/SVT/high grade AV block, sinus pause >3sec noted.   Echocardiogram 09/24/2019:  Left ventricle cavity is normal in size and wall thickness. Normal global  wall motion. Normal LV systolic function with EF 64%. Normal diastolic  filling pattern. Calculated EF 64%.  Trileaflet aortic valve. Moderate (Grade II) aortic regurgitation.  Moderate (Grade II) mitral regurgitation.  Mild tricuspid regurgitation. Estimated pulmonary artery systolic pressure  is 27 mmHg.  Aortic root mildly dilated at 3.8 cm, compared to 3.4 cm in 10/2018.  Otherwise, no significant change.  EKG 09/18/2019: Sinus rhythm 67 bpm Normal EKG  Echocardiogram 10/23/2018:   Normal LV systolic function with EF 56%. Left ventricle cavity is normal  in size. Normal left ventricular wall thickness. Normal global wall  motion. Normal diastolic filling pattern.  Trileaflet aortic valve. Mild (Grade I) aortic regurgitation.  Moderate (Grade II) mitral regurgitation.  Mild tricuspid regurgitation. Estimated pulmonary artery systolic pressure  is 31 mmHg.   Lexiscan Sestamibi Stress Test 10/08/2018: Stress EKG is non-diagnostic, as this is pharmacological stress test. Myocardial pefusion imaging is normal. All segments of left ventricle demonstrated normal wall motion and thickening.  Stress LV EF is calculated at 44% but visually appears normal.   Low risk study.  Holter Monitor on 09/20/2018 for 48 hours:  NSR/sinus tachycardia. 9 hrs 47 mins of tachycardia. Rare PAC and PVC with rare atrial couplet. Symptoms of shortness of breath, rapid HR correlated with 1 PVC and sinus tachycardia. Also had symptoms of shortness of breath with NSR. 2 episodes of atrial tachycardia without reported symptoms. 1 episode of NSVT for 17 beats on D2 at 1452 without symptoms. No A fib or SVT was noted.   Recent labs: 6-12/2018: Glucose 92, BUN/Cr 15/1.02. EGFR >60. Na/K 138/4.0.  H/H 14/45. MCV 96. Platelets 132 HbA1C N/A Chol 203, TG 120, HDL 51, LDL 131 TSH 0.9 normal   Review of Systems  Cardiovascular: Positive for palpitations (Improved). Negative for chest pain, dyspnea on exertion, leg swelling and syncope.  Neurological: Negative for light-headedness.       Vitals:   10/30/19 1547  BP: 139/88  Pulse: 65  SpO2: 99%  Body mass index is 29.79 kg/m. Filed Weights   10/30/19 1547  Weight: 179 lb (81.2 kg)     Objective:   Physical Exam Vitals and nursing note reviewed.  Constitutional:      General: She is not in acute distress. Neck:     Vascular: No JVD.  Cardiovascular:     Rate and Rhythm: Normal rate and regular rhythm.     Pulses: Intact distal  pulses.     Heart sounds: Murmur heard. High-pitched blowing holosystolic murmur is present with a grade of 1/6 at the apex.   Pulmonary:     Effort: Pulmonary effort is normal.     Breath sounds: Normal breath sounds. No wheezing or rales.           Assessment & Recommendations:   59 y.o. Caucasian female with hypertension, hyperlipidemia, mod MR, NSVT.  Mod MR: Clinically stable. Will repeat echocardiogram in 1 year  NSVT/palpitations: One episode during sleep. OSA may be contributing, although not formally tested. She wants to hold off testing, is going to try to lose weight.  Increase atenolol to 50 mg daily. Should also help hypertension.  F/u in 1 year   Nigel Mormon, MD [facility]. [place] Pager: [phone] Office: [phone]

## 2019-11-04 ENCOUNTER — Ambulatory Visit: Payer: 59 | Admitting: Cardiology

## 2019-12-04 ENCOUNTER — Ambulatory Visit: Payer: 59 | Admitting: Vascular Surgery

## 2020-01-01 ENCOUNTER — Other Ambulatory Visit: Payer: Self-pay | Admitting: Cardiology

## 2020-01-15 ENCOUNTER — Ambulatory Visit: Payer: 59 | Admitting: Vascular Surgery

## 2020-05-29 ENCOUNTER — Ambulatory Visit: Payer: 59 | Admitting: Gastroenterology

## 2020-06-07 IMAGING — CR CHEST - 2 VIEW
2 series · 2 of 2 positions shown · non-contrast
Comparison: None available

CLINICAL DATA: Heart palpitations 1.5 weeks with chest pain

EXAM:
CHEST - 2 VIEW

[w chest pa]
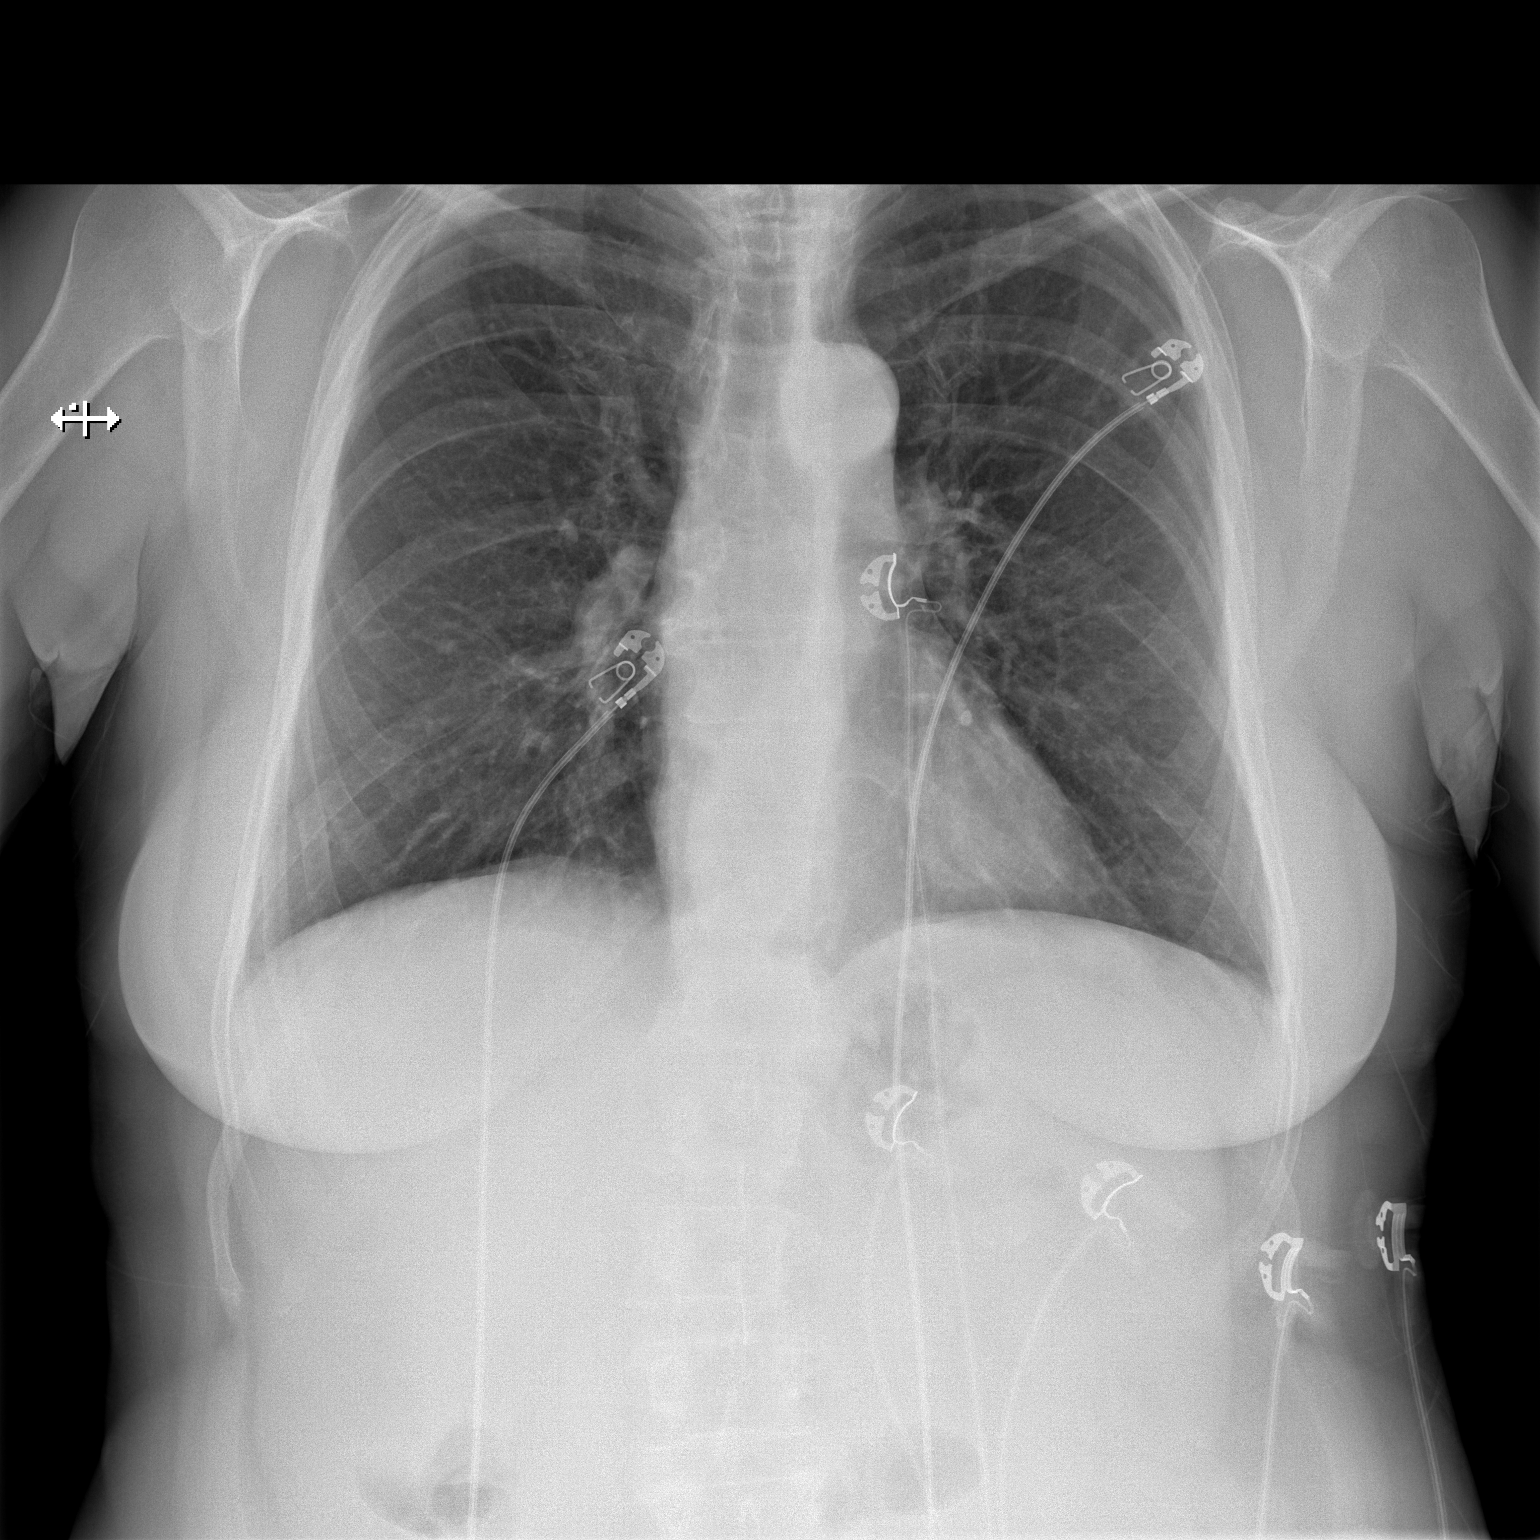

[w chest lat]
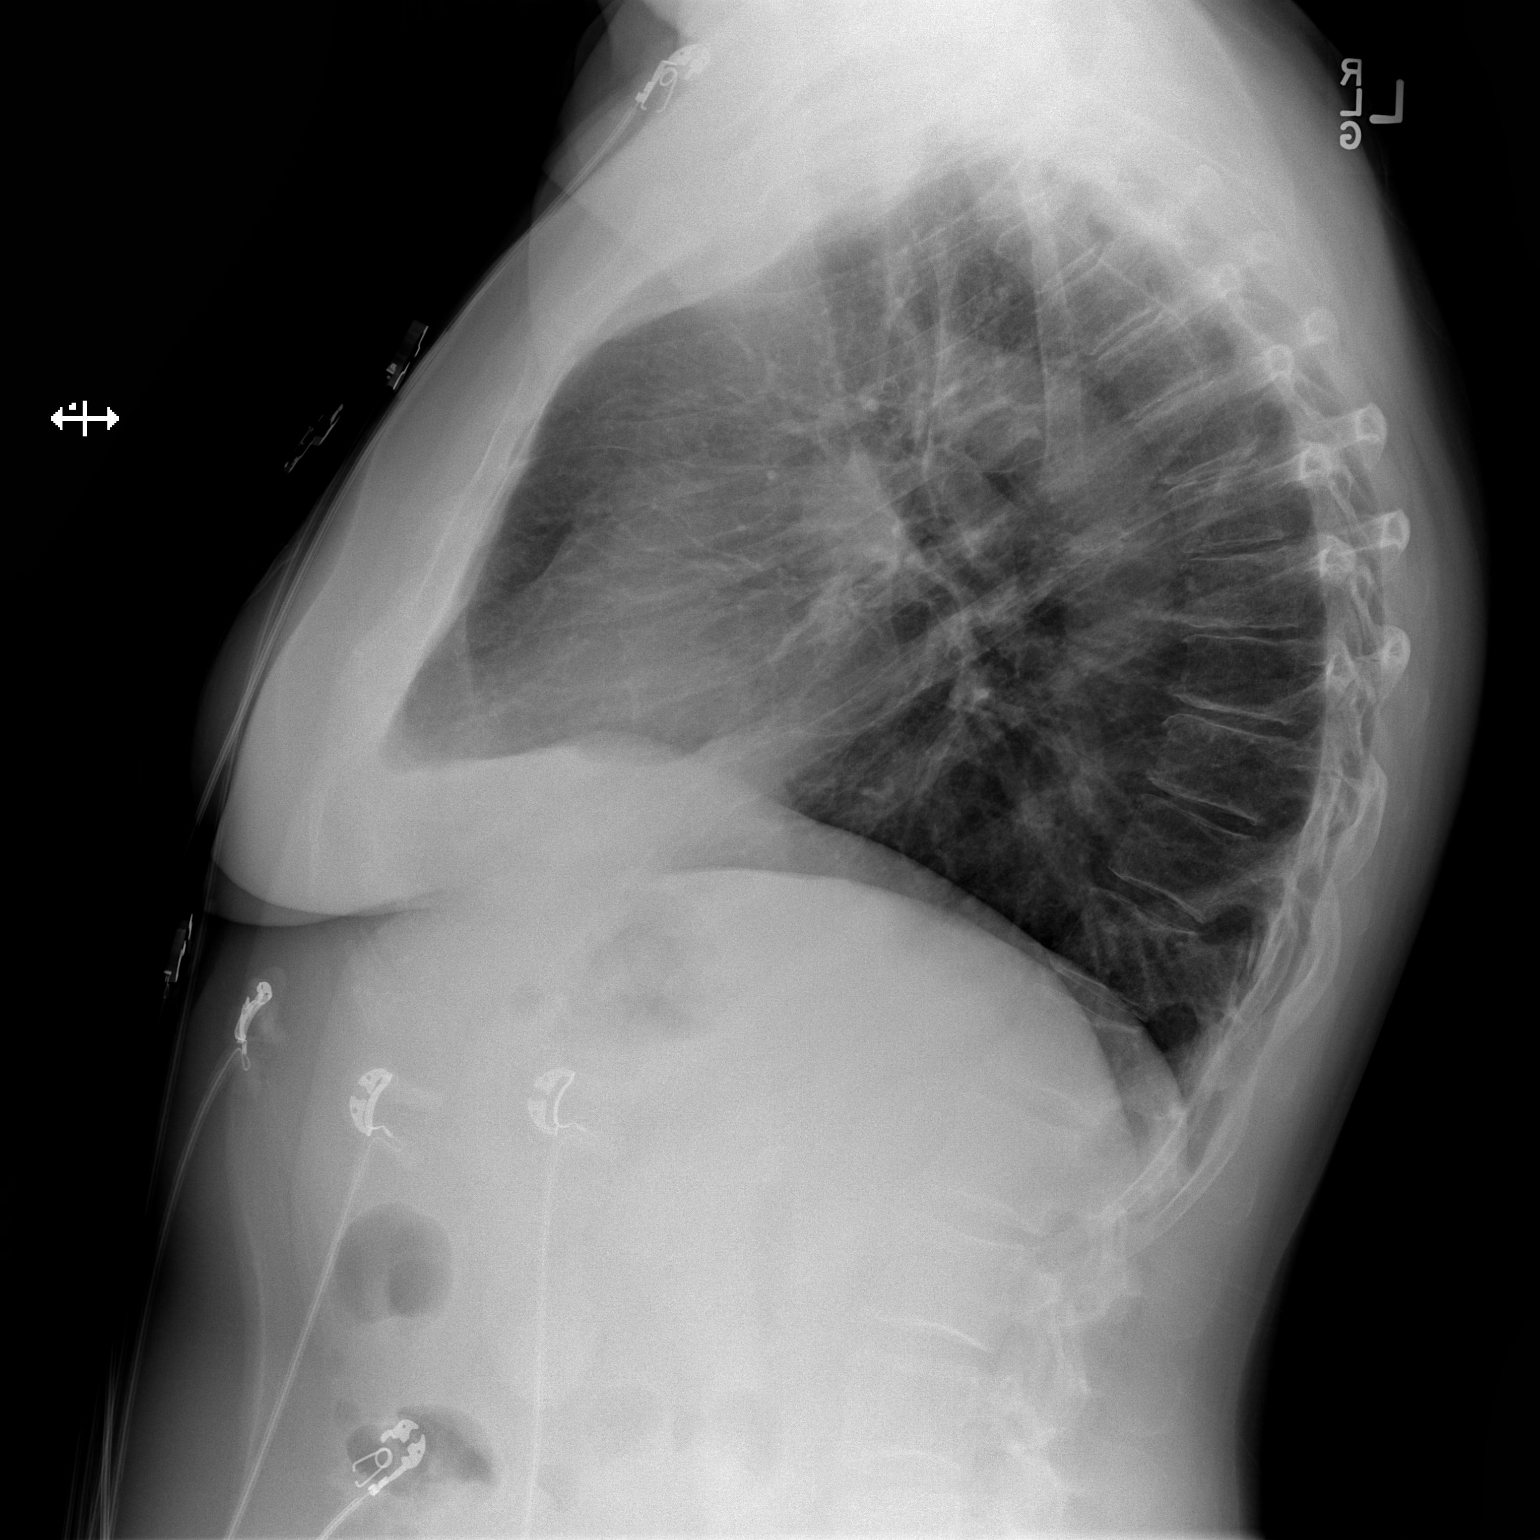

[2 of 2 positions shown; findings below may reference images not displayed]

FINDINGS: Normal heart size and mediastinal contours. No acute infiltrate or
edema. No effusion or pneumothorax. Exaggerated thoracic kyphosis.
No acute osseous findings.
IMPRESSION: Negative chest.

## 2020-07-13 ENCOUNTER — Ambulatory Visit: Payer: 59 | Admitting: Gastroenterology

## 2020-08-26 ENCOUNTER — Ambulatory Visit (INDEPENDENT_AMBULATORY_CARE_PROVIDER_SITE_OTHER): Payer: 59 | Admitting: Cardiology

## 2020-08-26 ENCOUNTER — Encounter: Payer: Self-pay | Admitting: Cardiology

## 2020-08-26 ENCOUNTER — Other Ambulatory Visit: Payer: Self-pay

## 2020-08-26 VITALS — BP 140/89 | HR 63 | Temp 98.0°F | Resp 17 | Ht 65.0 in | Wt 172.0 lb

## 2020-08-26 DIAGNOSIS — I472 Ventricular tachycardia: Secondary | ICD-10-CM

## 2020-08-26 DIAGNOSIS — I4729 Other ventricular tachycardia: Secondary | ICD-10-CM

## 2020-08-26 DIAGNOSIS — R0602 Shortness of breath: Secondary | ICD-10-CM

## 2020-08-26 DIAGNOSIS — I10 Essential (primary) hypertension: Secondary | ICD-10-CM

## 2020-08-26 MED ORDER — DILTIAZEM HCL ER COATED BEADS 120 MG PO CP24: 120.0000 mg | ORAL_CAPSULE | Freq: Every day | ORAL | 3 refills | Status: DC

## 2020-08-26 NOTE — Progress Notes (Signed)
Follow up visit  Subjective:   Rachel Mccarty, female    DOB: [birth year], 60 y.o.   MRN: [id number]   HPI   Chief Complaint  Patient presents with  . Shortness of Breath    A few weeks ago, it comes and goes  . Palpitations   60 y.o. Caucasian female with hypertension, hyperlipidemia, mod MR, NSVT.  In the last few days, patient has had episodes of sudden onset shortness of breath, lasting for few seconds. Episodes are sometimes associated with palpitations. On the other hand, she is able to walk 2-3 miles without any shortness of breath.   On a separate note, she has lost 7 lbs since her last visit.   Current Outpatient Medications on File Prior to Visit  Medication Sig Dispense Refill  . ALPRAZolam (XANAX) 0.25 MG tablet Take 0.125-0.25 mg by mouth 2 (two) times daily as needed.    Marland Kitchen atenolol (TENORMIN) 50 MG tablet Take 1 tablet (50 mg total) by mouth daily. 90 tablet 3  . fluticasone (FLONASE) 50 MCG/ACT nasal spray Place 2 sprays into both nostrils daily.    . Multiple Vitamin tablet Take 1 tablet by mouth daily.    Marland Kitchen omeprazole (PRILOSEC) 40 MG capsule Take 40 mg by mouth daily.     No current facility-administered medications on file prior to visit.    Cardiovascular & other pertient studies:  EKG 08/26/2020: Sinus rhythm 57 bpm   Event monitor 09/24/2019 - 10/23/2019: Diagnostic time: 88%  Dominant rhythm: Sinus. HR 50-130 bpm. Avg HR 79 bpm. 1 auto detected event shows 7 beat NSVT (09/28/19 11:44 PM CST) Patient triggered events show sinus rhythm/ tachycardia No atrial fibrillation/atrial flutter/SVT/high grade AV block, sinus pause >3sec noted.   Echocardiogram 09/24/2019:  Left ventricle cavity is normal in size and wall thickness. Normal global  wall motion. Normal LV systolic function with EF 64%. Normal diastolic  filling pattern. Calculated EF 64%.  Trileaflet aortic valve. Moderate (Grade II) aortic regurgitation.  Moderate (Grade II) mitral  regurgitation.  Mild tricuspid regurgitation. Estimated pulmonary artery systolic pressure  is 27 mmHg.  Aortic root mildly dilated at 3.8 cm, compared to 3.4 cm in 10/2018.  Otherwise, no significant change.  EKG 09/18/2019: Sinus rhythm 67 bpm Normal EKG  Echocardiogram 10/23/2018:  Normal LV systolic function with EF 56%. Left ventricle cavity is normal  in size. Normal left ventricular wall thickness. Normal global wall  motion. Normal diastolic filling pattern.  Trileaflet aortic valve. Mild (Grade I) aortic regurgitation.  Moderate (Grade II) mitral regurgitation.  Mild tricuspid regurgitation. Estimated pulmonary artery systolic pressure  is 31 mmHg.   Lexiscan Sestamibi Stress Test 10/08/2018: Stress EKG is non-diagnostic, as this is pharmacological stress test. Myocardial pefusion imaging is normal. All segments of left ventricle demonstrated normal wall motion and thickening.  Stress LV EF is calculated at 44% but visually appears normal.   Low risk study.  Holter Monitor on 09/20/2018 for 48 hours:  NSR/sinus tachycardia. 9 hrs 47 mins of tachycardia. Rare PAC and PVC with rare atrial couplet. Symptoms of shortness of breath, rapid HR correlated with 1 PVC and sinus tachycardia. Also had symptoms of shortness of breath with NSR. 2 episodes of atrial tachycardia without reported symptoms. 1 episode of NSVT for 17 beats on D2 at 14:52 without symptoms. No A fib or SVT was noted.   Recent labs: 6-12/2018: Glucose 92, BUN/Cr 15/1.02. EGFR >60. Na/K 138/4.0.  H/H 14/45. MCV 96. Platelets 132 HbA1C N/A  Chol 203, TG 120, HDL 51, LDL 131 TSH 0.9 normal   Review of Systems  Cardiovascular: Positive for palpitations (Improved). Negative for chest pain, dyspnea on exertion, leg swelling and syncope.  Neurological: Negative for light-headedness.       Vitals:   08/26/20 1251  BP: 140/89  Pulse: 63  Resp: 17  Temp: 98 F (36.7 C)  SpO2: 97%     Body mass index is 28.62  kg/m. Filed Weights   08/26/20 1251  Weight: 172 lb (78 kg)     Objective:   Physical Exam Vitals and nursing note reviewed.  Constitutional:      General: She is not in acute distress. Neck:     Vascular: No JVD.  Cardiovascular:     Rate and Rhythm: Normal rate and regular rhythm.     Pulses: Intact distal pulses.     Heart sounds: Murmur heard.  High-pitched blowing holosystolic murmur is present with a grade of 1/6 at the apex.   Pulmonary:     Effort: Pulmonary effort is normal.     Breath sounds: Normal breath sounds. No wheezing or rales.           Assessment & Recommendations:   60 y.o. Caucasian female with hypertension, hyperlipidemia, mod MR, NSVT.  NSVT/palpitations: I suspect her shortness of breath unrelated to exertion could be related to her NSVT. Added diltiazem 120 mg daily. Continue atenolol 50 mg daily. Repeat echocardiogram.  F/u in 4 weeks   [place], MD [facility] Pager: [phone] Office: [phone]

## 2020-08-31 ENCOUNTER — Encounter: Payer: Self-pay | Admitting: Gastroenterology

## 2020-08-31 ENCOUNTER — Ambulatory Visit (INDEPENDENT_AMBULATORY_CARE_PROVIDER_SITE_OTHER): Payer: 59 | Admitting: Gastroenterology

## 2020-08-31 VITALS — BP 118/68 | HR 56 | Ht 65.0 in | Wt 145.8 lb

## 2020-08-31 DIAGNOSIS — R143 Flatulence: Secondary | ICD-10-CM

## 2020-08-31 DIAGNOSIS — K219 Gastro-esophageal reflux disease without esophagitis: Secondary | ICD-10-CM

## 2020-08-31 DIAGNOSIS — R140 Abdominal distension (gaseous): Secondary | ICD-10-CM | POA: Diagnosis not present

## 2020-08-31 DIAGNOSIS — R109 Unspecified abdominal pain: Secondary | ICD-10-CM | POA: Diagnosis not present

## 2020-08-31 DIAGNOSIS — E739 Lactose intolerance, unspecified: Secondary | ICD-10-CM

## 2020-08-31 DIAGNOSIS — R1310 Dysphagia, unspecified: Secondary | ICD-10-CM

## 2020-08-31 MED ORDER — DICYCLOMINE HCL 10 MG PO CAPS: 10.0000 mg | ORAL_CAPSULE | Freq: Three times a day (TID) | ORAL | 0 refills | Status: DC

## 2020-08-31 MED ORDER — NA SULFATE-K SULFATE-MG SULF 17.5-3.13-1.6 GM/177ML PO SOLN: ORAL | 0 refills | Status: DC

## 2020-08-31 NOTE — Patient Instructions (Signed)
Lactose-Free Diet, Adult If you have lactose intolerance, you are not able to digest lactose. Lactose is a natural sugar found mainly in dairy milk and dairy products. You may need to avoid all foods and beverages that contain lactose. A lactose-free diet can help you do this. Which foods have lactose? Lactose is found in dairy milk and dairy products, such as:  Yogurt.  Cheese.  Butter.  Margarine.  Sour cream.  Cream.  Whipped toppings and nondairy creamers.  Ice cream and other dairy-based desserts. Lactose is also found in foods or products made with dairy milk or milk ingredients. To find out whether a food contains dairy milk or a milk ingredient, look at the ingredients list. Avoid foods with the statement "May contain milk" and foods that contain:  Milk powder.  Whey.  Curd.  Caseinate.  Lactose.  Lactalbumin.  Lactoglobulin. What are alternatives to dairy milk and foods made with milk products?  Lactose-free milk.  Soy milk with added calcium and vitamin D.  Almond milk, coconut milk, rice milk, or other nondairy milk alternatives with added calcium and vitamin D. Note that these are low in protein.  Soy products, such as soy yogurt, soy cheese, soy ice cream, and soy-based sour cream.  Other nut milk products, such as almond yogurt, almond cheese, cashew yogurt, cashew cheese, cashew ice cream, coconut yogurt, and coconut ice cream. What are tips for following this plan?  Do not consume foods, beverages, vitamins, minerals, or medicines containing lactose. Read ingredient lists carefully.  Look for the words "lactose-free" on labels.  Use lactase enzyme drops or tablets as directed by your health care provider.  Use lactose-free milk or a milk alternative, such as soy milk or almond milk, for drinking and cooking.  Make sure you get enough calcium and vitamin D in your diet. A lactose-free eating plan can be lacking in these important  nutrients.  Take calcium and vitamin D supplements as directed by your health care provider. Talk to your health care provider about supplements if you are not able to get enough calcium and vitamin D from food. What foods can I eat? Fruits All fresh, canned, frozen, or dried fruits that are not processed with lactose. Vegetables All fresh, frozen, and canned vegetables without cheese, cream, or butter sauces. Grains Any that are not made with dairy milk or dairy products. Meats and other proteins Any meat, fish, poultry, and other protein sources that are not made with dairy milk or dairy products. Soy cheese and yogurt. Fats and oils Any that are not made with dairy milk or dairy products. Beverages Lactose-free milk. Soy, rice, or almond milk with added calcium and vitamin D. Fruit and vegetable juices. Sweets and desserts Any that are not made with dairy milk or dairy products. Seasonings and condiments Any that are not made with dairy milk or dairy products. Calcium Calcium is found in many foods that contain lactose and is important for bone health. The amount of calcium you need depends on your age:  Adults younger than 50 years: 1,000 mg of calcium a day.  Adults older than 50 years: 1,200 mg of calcium a day. If you are not getting enough calcium, you may get it from other sources, including:  Orange juice with calcium added. There are 300-350 mg of calcium in 1 cup of orange juice.  Calcium-fortified soy milk. There are 300-400 mg of calcium in 1 cup of calcium-fortified soy milk.  Calcium-fortified rice or almond milk. There  are 300 mg of calcium in 1 cup of calcium-fortified rice or almond milk.  Calcium-fortified breakfast cereals. There are 100-1,000 mg of calcium in calcium-fortified breakfast cereals.  Spinach, cooked. There are 145 mg of calcium in  cup of cooked spinach.  Edamame, cooked. There are 130 mg of calcium in  cup of cooked edamame.  Collard  greens, cooked. There are 125 mg of calcium in  cup of cooked collard greens.  Kale, frozen or cooked. There are 90 mg of calcium in  cup of cooked or frozen kale.  Almonds. There are 95 mg of calcium in  cup of almonds.  Broccoli, cooked. There are 60 mg of calcium in 1 cup of cooked broccoli. The items listed above may not be a complete list of recommended foods and beverages. Contact a dietitian for more options.   What foods are not recommended? Fruits None, unless they are made with dairy milk or dairy products. Vegetables None, unless they are made with dairy milk or dairy products. Grains Any grains that are made with dairy milk or dairy products. Meats and other proteins None, unless they are made with dairy milk or dairy products. Dairy All dairy products, including milk, goat's milk, buttermilk, kefir, acidophilus milk, flavored milk, evaporated milk, condensed milk, dulce de [place], eggnog, yogurt, cheese, and cheese spreads. Fats and oils Any that are made with milk or milk products. Margarines and salad dressings that contain milk or cheese. Cream. Half and half. Cream cheese. Sour cream. Chip dips made with sour cream or yogurt. Beverages Hot chocolate. Cocoa with lactose. Instant iced teas. Powdered fruit drinks. Smoothies made with dairy milk or yogurt. Sweets and desserts Any that are made with milk or milk products. Seasonings and condiments Chewing gum that has lactose. Spice blends if they contain lactose. Artificial sweeteners that contain lactose. Nondairy creamers. The items listed above may not be a complete list of foods and beverages to avoid. Contact a dietitian for more information. Summary  If you are lactose intolerant, it means that you have a hard time digesting lactose, a natural sugar found in milk and milk products.  Following a lactose-free diet can help you manage this condition.  Calcium is important for bone health and is found in many foods  that contain lactose. Talk with your health care provider about other sources of calcium. This information is not intended to replace advice given to you by your health care provider. Make sure you discuss any questions you have with your health care provider. Document Revised: 05/02/2017 Document Reviewed: 05/02/2017 Elsevier Patient Education  2021 [place] PPI and antireflux measures    Due to recent changes in healthcare laws, you may see the results of your imaging and laboratory studies on MyChart before your provider has had a chance to review them.  We understand that in some cases there may be results that are confusing or concerning to you. Not all laboratory results come back in the same time frame and the provider may be waiting for multiple results in order to interpret others.  Please give [place] 48 hours in order for your provider to thoroughly review all the results before contacting the office for clarification of your results.   I appreciate the  opportunity to care for you  Thank You   Harl Bowie , MD

## 2020-08-31 NOTE — Progress Notes (Signed)
Rachel Mccarty    [id number]    1961-04-08  Primary Care Physician:Kaplan, Baldemar Friday., PA-C  Referring Physician: Aletha Halim., PA-C [address]  [place] [address]   Chief complaint:  Gas, abdominal pain, GERD, abdominal bloating  HPI:  60 year old very pleasant female here with complaints of lower abdominal pain associated with excessive gas and bloating.  She has been having on and off discomfort for past 3 to 4 years. She was having diarrhea before but no longer has it.  Denies any constipation.  No rectal bleeding or melena. Last colonoscopy in 2013 by Dr. Juanita Craver, report is not available to review.  No polyps per patient She intentionally lost 30 pounds in the past year with dietary changes and exercise  Complains of intermittent dysphagia, worse with meat crusty bread or raw vegetables.  Reflux symptoms are currently well controlled with daily omeprazole. She had endoscopy at [facility] in 2018, was unremarkable per patient.  Report is not available during this visit.  Abdominal ultrasound Aug 30, 2013 Fatty infiltration of the liver. No acute findings.   Outpatient Encounter Medications as of 08/31/2020  Medication Sig  . ALPRAZolam (XANAX) 0.25 MG tablet Take 0.125-0.25 mg by mouth 2 (two) times daily as needed.  Marland Kitchen atenolol (TENORMIN) 50 MG tablet Take 1 tablet (50 mg total) by mouth daily.  Marland Kitchen diltiazem (CARDIZEM CD) 120 MG 24 hr capsule Take 1 capsule (120 mg total) by mouth daily.  . fluticasone (FLONASE) 50 MCG/ACT nasal spray Place 2 sprays into both nostrils daily.  . Multiple Vitamin tablet Take 1 tablet by mouth daily.  Marland Kitchen omeprazole (PRILOSEC) 40 MG capsule Take 40 mg by mouth daily.   No facility-administered encounter medications on file as of 08/31/2020.    Allergies as of 08/31/2020  . (No Known Allergies)    Past Medical History:  Diagnosis Date  . Acid reflux   . Hyperlipidemia   . Hypertension    . Palpitations     Past Surgical History:  Procedure Laterality Date  . OVARIAN CYST SURGERY      Family History  Problem Relation Age of Onset  . Sudden Cardiac Death Mother   . Dementia Mother   . High blood pressure Father   . Heart attack Maternal Grandmother   . Stroke Maternal Grandfather     Social History   Socioeconomic History  . Marital status: Married    Spouse name: Not on file  . Number of children: 2  . Years of education: Not on file  . Highest education level: Not on file  Occupational History  . Not on file  Tobacco Use  . Smoking status: Never Smoker  . Smokeless tobacco: Never Used  Vaping Use  . Vaping Use: Never used  Substance and Sexual Activity  . Alcohol use: Yes    Alcohol/week: 1.0 standard drink    Types: 1 Cans of beer per week    Comment: 1 drink per day  . Drug use: Never  . Sexual activity: Not on file  Other Topics Concern  . Not on file  Social History Narrative  . Not on file   Social Determinants of Health   Financial Resource Strain: Not on file  Food Insecurity: Not on file  Transportation Needs: Not on file  Physical Activity: Not on file  Stress: Not on file  Social Connections: Not on file  Intimate Partner  Violence: Not on file      Review of systems: All other review of systems negative except as mentioned in the HPI.   Physical Exam: Vitals:   08/31/20 1404  BP: 118/68  Pulse: (!) 56  SpO2: 97%   Body mass index is 24.26 kg/m. Gen:      No acute distress HEENT:  sclera anicteric Abd:      soft, non-tender; no palpable masses, no distension Ext:    No edema Neuro: alert and oriented x 3 Psych: normal mood and affect  Data Reviewed:  Reviewed labs, radiology imaging, old records and pertinent past GI work up   Assessment and Plan/Recommendations:  60 year old very pleasant female with complaints of lower abdominal pain, bloating and excessive gas Most likely her symptoms are secondary to  irritable bowel syndrome but will need to exclude any colon pathology or small intestinal bacterial overgrowth Given last colonoscopy was almost over 9 years ago, will plan to proceed with colonoscopy for further evaluation If colonoscopy unremarkable, will consider lactulose breath test to exclude small intestinal bacterial overgrowth Trial of lactose-free diet for 1 to 2 weeks  Solid dysphagia: Schedule for EGD with esophageal dilation  The risks and benefits as well as alternatives of endoscopic procedure(s) have been discussed and reviewed. All questions answered. The patient agrees to proceed.   GERD: Continue omeprazole 40 mg daily before breakfast and antireflux measures  IBS: Use dicyclomine 10 mg every 6-8 hours as needed  Return after EGD and colonoscopy   The patient was provided an opportunity to ask questions and all were answered. The patient agreed with the plan and demonstrated an understanding of the instructions.  Damaris Hippo , MD    CC: Rachel Halim., PA-C

## 2020-09-01 ENCOUNTER — Telehealth: Payer: Self-pay | Admitting: *Deleted

## 2020-09-01 NOTE — Telephone Encounter (Signed)
Records for review in Dr Jillyn Hidden inbox

## 2020-09-03 ENCOUNTER — Ambulatory Visit: Payer: 59

## 2020-09-03 ENCOUNTER — Other Ambulatory Visit: Payer: Self-pay

## 2020-09-03 DIAGNOSIS — I340 Nonrheumatic mitral (valve) insufficiency: Secondary | ICD-10-CM

## 2020-09-08 NOTE — Telephone Encounter (Signed)
Office notes from [facility] faxed to the office today added them to the other records in Dr Jillyn Hidden in box on her desk

## 2020-09-13 ENCOUNTER — Other Ambulatory Visit: Payer: Self-pay | Admitting: Cardiology

## 2020-09-13 DIAGNOSIS — I472 Ventricular tachycardia: Secondary | ICD-10-CM

## 2020-09-13 DIAGNOSIS — I4729 Other ventricular tachycardia: Secondary | ICD-10-CM

## 2020-09-13 DIAGNOSIS — I10 Essential (primary) hypertension: Secondary | ICD-10-CM

## 2020-09-28 ENCOUNTER — Ambulatory Visit: Payer: 59 | Admitting: Cardiology

## 2020-09-28 NOTE — Progress Notes (Signed)
Error

## 2020-10-14 NOTE — Progress Notes (Signed)
Follow up visit  Subjective:   Rachel Mccarty, female    DOB: 1961-01-20, 60 y.o.   MRN: [id number]   HPI  Chief Complaint  Patient presents with   NSVT   Follow-up    1 year   NSVT (nonsustained ventricular tachycardia) ([place])    60 y.o. Caucasian female with hypertension, hyperlipidemia, mod MR, NSVT.  Symptoms of palpitations have improved after starting diltiazem. Reviewed recent echocardiogram and lab results with the patient, details below.    Current Outpatient Medications on File Prior to Visit  Medication Sig Dispense Refill   ALPRAZolam (XANAX) 0.25 MG tablet Take 0.125-0.25 mg by mouth 2 (two) times daily as needed.     atenolol (TENORMIN) 50 MG tablet TAKE 1 TABLET BY MOUTH EVERY DAY 90 tablet 3   dicyclomine (BENTYL) 10 MG capsule Take 1 capsule (10 mg total) by mouth 4 (four) times daily -  before meals and at bedtime. 90 capsule 0   diltiazem (CARDIZEM CD) 120 MG 24 hr capsule Take 1 capsule (120 mg total) by mouth daily. 30 capsule 3   fluticasone (FLONASE) 50 MCG/ACT nasal spray Place 2 sprays into both nostrils daily.     Multiple Vitamin tablet Take 1 tablet by mouth daily.     Na Sulfate-K Sulfate-Mg Sulf 17.5-3.13-1.6 GM/177ML SOLN I kit as directed 354 mL 0   omeprazole (PRILOSEC) 40 MG capsule Take 40 mg by mouth daily.     No current facility-administered medications on file prior to visit.    Cardiovascular & other pertient studies:  EKG 10/15/2020: Sinus rhythm 64 bpm  Low voltage in precordial leads Poor R-wave progression  Echocardiogram 09/03/2020:  Left ventricle cavity is normal in size and wall thickness. Normal global  wall motion. Normal LV systolic function with EF 67%. Normal diastolic  filling pattern.  The aortic root is mildly dilated at 3.8 cm.  Structurally normal trileaflet aortic valve. No evidence of aortic  stenosis. Moderate (Grade II) aortic regurgitation.  Mild to moderate mitral regurgitation.  Mild tricuspid  regurgitation.  No evidence of pulmonary hypertension.  No significant change compare to previous study on 09/24/2019.   Event monitor 09/24/2019 - 10/23/2019: Diagnostic time: 88%  Dominant rhythm: Sinus. HR 50-130 bpm. Avg HR 79 bpm. 1 auto detected event shows 7 beat NSVT (09/28/19 11:44 PM CST) Patient triggered events show sinus rhythm/ tachycardia No atrial fibrillation/atrial flutter/SVT/high grade AV block, sinus pause >3sec noted.   Echocardiogram 09/24/2019:  Left ventricle cavity is normal in size and wall thickness. Normal global  wall motion. Normal LV systolic function with EF 64%. Normal diastolic  filling pattern. Calculated EF 64%.  Trileaflet aortic valve.  Moderate (Grade II) aortic regurgitation.  Moderate (Grade II) mitral regurgitation.  Mild tricuspid regurgitation. Estimated pulmonary artery systolic pressure  is 27 mmHg.  Aortic root mildly dilated at 3.8 cm, compared to 3.4 cm in 10/2018.  Otherwise, no significant change.  Echocardiogram 10/23/2018:  Normal LV systolic function with EF 56%. Left ventricle cavity is normal  in size. Normal left ventricular wall thickness. Normal global wall  motion. Normal diastolic filling pattern.  Trileaflet aortic valve. Mild (Grade I) aortic regurgitation.  Moderate (Grade II) mitral regurgitation.  Mild tricuspid regurgitation. Estimated pulmonary artery systolic pressure  is 31 mmHg.   Lexiscan Sestamibi Stress Test 10/08/2018: Stress EKG is non-diagnostic, as this is pharmacological stress test. Myocardial pefusion imaging is normal. All segments of left ventricle demonstrated normal wall motion and thickening.  Stress  LV EF is calculated at 44% but visually appears normal.   Low risk study.  Holter Monitor on 09/20/2018 for 48 hours:  NSR/sinus tachycardia. 9 hrs 47 mins of tachycardia. Rare PAC and PVC with rare atrial couplet. Symptoms of shortness of breath, rapid HR correlated with 1 PVC and sinus tachycardia. Also  had symptoms of shortness of breath with NSR. 2 episodes of atrial tachycardia without reported symptoms. 1 episode of NSVT for 17 beats on D2 at 14:52 without symptoms. No A fib or SVT was noted.   Recent labs: 07/06/2020: Glucose 85, BUN/Cr 11/1.03. EGFR 62. Na/K 140/3.7. Rest of the CMP normal H/H 13/39. MCV 93. Platelets 127 Chol 246, TG 68, HDL 57, LDL 168 TSH 1.29 normal  6-12/2018: Glucose 92, BUN/Cr 15/1.02. EGFR >60. Na/K 138/4.0.  H/H 14/45. MCV 96. Platelets 132 HbA1C N/A Chol 203, TG 120, HDL 51, LDL 131 TSH 0.9 normal   Review of Systems  Cardiovascular:  Negative for chest pain, dyspnea on exertion, leg swelling, palpitations and syncope.      Vitals:   10/15/20 0912  BP: 130/85  Pulse: 68  Resp: 16  Temp: 98.4 F (36.9 C)  SpO2: 98%    There is no height or weight on file to calculate BMI. Filed Weights   10/15/20 0912  Weight: 148 lb 6.4 oz (67.3 kg)     Objective:   Physical Exam Vitals and nursing note reviewed.  Constitutional:      General: She is not in acute distress. Neck:     Vascular: No JVD.  Cardiovascular:     Rate and Rhythm: Normal rate and regular rhythm.     Heart sounds: Murmur heard.  Diastolic murmur is present.  Pulmonary:     Effort: Pulmonary effort is normal.     Breath sounds: Normal breath sounds. No wheezing or rales.  Musculoskeletal:     Right lower leg: No edema.     Left lower leg: No edema.          Assessment & Recommendations:   60 y.o. Caucasian female with hypertension, hyperlipidemia, mod MR, NSVT.  NSVT/palpitations: Well controlled on diltiazem 120 mg daily, atenolol 50 mg daily  AR, MR: Clinically asymptomatic. Repeat echocardiogram in 08/2021.  Mixed hyperlipidemia: Uncontrolled. Recommend diet and lifestyle modification. She is unsure about statin at this time. Recommend CT cardiac scoring for risk stratification.  F/u in 1 year   Nigel Mormon, MD [facility].  PA Pager: [phone] Office: [phone]

## 2020-10-15 ENCOUNTER — Ambulatory Visit: Payer: 59 | Admitting: Cardiology

## 2020-10-15 ENCOUNTER — Other Ambulatory Visit: Payer: Self-pay

## 2020-10-15 ENCOUNTER — Encounter: Payer: Self-pay | Admitting: Cardiology

## 2020-10-15 VITALS — BP 130/85 | HR 68 | Temp 98.4°F | Resp 16 | Ht 65.0 in | Wt 148.4 lb

## 2020-10-15 DIAGNOSIS — I4729 Other ventricular tachycardia: Secondary | ICD-10-CM

## 2020-10-15 DIAGNOSIS — I10 Essential (primary) hypertension: Secondary | ICD-10-CM

## 2020-10-15 DIAGNOSIS — E782 Mixed hyperlipidemia: Secondary | ICD-10-CM

## 2020-10-15 DIAGNOSIS — I351 Nonrheumatic aortic (valve) insufficiency: Secondary | ICD-10-CM | POA: Insufficient documentation

## 2020-10-20 ENCOUNTER — Other Ambulatory Visit: Payer: 59

## 2020-10-26 ENCOUNTER — Ambulatory Visit: Payer: 59 | Admitting: Cardiology

## 2020-11-02 ENCOUNTER — Encounter: Payer: Self-pay | Admitting: Obstetrics & Gynecology

## 2020-11-26 ENCOUNTER — Other Ambulatory Visit: Payer: 59

## 2020-12-01 ENCOUNTER — Telehealth: Payer: Self-pay | Admitting: Gastroenterology

## 2020-12-01 NOTE — Telephone Encounter (Signed)
Hi Dr. Silverio Decamp, this patient just called to cancel procedure that was scheduled on Friday 12/04/20 due to a family emergency. Patient has rescheduled to 12/30/20. Thank you.

## 2020-12-03 NOTE — Telephone Encounter (Signed)
ok 

## 2020-12-04 ENCOUNTER — Encounter: Payer: 59 | Admitting: Gastroenterology

## 2020-12-15 ENCOUNTER — Ambulatory Visit
Admission: RE | Admit: 2020-12-15 | Discharge: 2020-12-15 | Disposition: A | Discharge status: Home / Self Care | Payer: No Typology Code available for payment source | Source: Ambulatory Visit | Attending: Cardiology | Admitting: Cardiology

## 2020-12-15 DIAGNOSIS — E782 Mixed hyperlipidemia: Secondary | ICD-10-CM

## 2020-12-30 ENCOUNTER — Other Ambulatory Visit: Payer: Self-pay

## 2020-12-30 ENCOUNTER — Ambulatory Visit (AMBULATORY_SURGERY_CENTER): Payer: 59 | Admitting: Gastroenterology

## 2020-12-30 ENCOUNTER — Encounter: Payer: Self-pay | Admitting: Gastroenterology

## 2020-12-30 VITALS — BP 126/86 | HR 68 | Temp 98.2°F | Resp 13 | Ht 65.0 in | Wt 145.0 lb

## 2020-12-30 DIAGNOSIS — K5730 Diverticulosis of large intestine without perforation or abscess without bleeding: Secondary | ICD-10-CM

## 2020-12-30 DIAGNOSIS — K209 Esophagitis, unspecified without bleeding: Secondary | ICD-10-CM

## 2020-12-30 DIAGNOSIS — R140 Abdominal distension (gaseous): Secondary | ICD-10-CM

## 2020-12-30 DIAGNOSIS — K2970 Gastritis, unspecified, without bleeding: Secondary | ICD-10-CM

## 2020-12-30 DIAGNOSIS — D123 Benign neoplasm of transverse colon: Secondary | ICD-10-CM

## 2020-12-30 DIAGNOSIS — R1084 Generalized abdominal pain: Secondary | ICD-10-CM | POA: Diagnosis not present

## 2020-12-30 DIAGNOSIS — K208 Other esophagitis without bleeding: Secondary | ICD-10-CM

## 2020-12-30 DIAGNOSIS — Z1211 Encounter for screening for malignant neoplasm of colon: Secondary | ICD-10-CM

## 2020-12-30 DIAGNOSIS — R1310 Dysphagia, unspecified: Secondary | ICD-10-CM

## 2020-12-30 DIAGNOSIS — K2100 Gastro-esophageal reflux disease with esophagitis, without bleeding: Secondary | ICD-10-CM | POA: Diagnosis not present

## 2020-12-30 DIAGNOSIS — K449 Diaphragmatic hernia without obstruction or gangrene: Secondary | ICD-10-CM

## 2020-12-30 DIAGNOSIS — D122 Benign neoplasm of ascending colon: Secondary | ICD-10-CM

## 2020-12-30 DIAGNOSIS — R143 Flatulence: Secondary | ICD-10-CM

## 2020-12-30 DIAGNOSIS — K648 Other hemorrhoids: Secondary | ICD-10-CM

## 2020-12-30 DIAGNOSIS — R109 Unspecified abdominal pain: Secondary | ICD-10-CM

## 2020-12-30 MED ORDER — SODIUM CHLORIDE 0.9 % IV SOLN: 500.0000 mL | Freq: Once | INTRAVENOUS | Status: DC

## 2020-12-30 NOTE — Op Note (Signed)
[facility] Patient Name: Rachel Mccarty Procedure Date: 12/30/2020 7:27 AM MRN: [id number] Endoscopist: Mauri Pole , MD Age: 60 Referring MD:  Date of Birth: 06-20-1960 Gender: Female Account #: [id number] Procedure:                Colonoscopy Indications:              Screening for colorectal malignant neoplasm,                            Incidental abdominal distress noted Medicines:                Monitored Anesthesia Care Procedure:                Pre-Anesthesia Assessment:                           - Prior to the procedure, a History and Physical                            was performed, and patient medications and                            allergies were reviewed. The patient's tolerance of                            previous anesthesia was also reviewed. The risks                            and benefits of the procedure and the sedation                            options and risks were discussed with the patient.                            All questions were answered, and informed consent                            was obtained. Prior Anticoagulants: The patient has                            taken no previous anticoagulant or antiplatelet                            agents. ASA Grade Assessment: II - A patient with                            mild systemic disease. After reviewing the risks                            and benefits, the patient was deemed in                            satisfactory condition to undergo the procedure.  After obtaining informed consent, the colonoscope                            was passed under direct vision. Throughout the                            procedure, the patient's blood pressure, pulse, and                            oxygen saturations were monitored continuously. The                            Olympus [id number]-[id number]) Colonoscope was                            introduced through the anus  and advanced to the the                            cecum, identified by appendiceal orifice and                            ileocecal valve. The colonoscopy was performed                            without difficulty. The patient tolerated the                            procedure well. The quality of the bowel                            preparation was excellent. The ileocecal valve,                            appendiceal orifice, and rectum were photographed. Scope In: 8:26:52 AM Scope Out: 8:47:29 AM Scope Withdrawal Time: 0 hours 14 minutes 58 seconds  Total Procedure Duration: 0 hours 20 minutes 37 seconds  Findings:                 The perianal and digital rectal examinations were                            normal.                           Two sessile polyps were found in the transverse                            colon and ascending colon. The polyps were 1 to 2                            mm in size. These polyps were removed with a cold                            biopsy forceps. Resection and retrieval were  complete.                           A few small-mouthed diverticula were found in the                            sigmoid colon.                           A 7 mm polyp was found in the transverse colon. The                            polyp was sessile. The polyp was removed with a                            cold snare. Resection and retrieval were complete.                           Non-bleeding external and internal hemorrhoids were                            found during retroflexion. The hemorrhoids were                            medium-sized. Complications:            No immediate complications. Estimated Blood Loss:     Estimated blood loss was minimal. Impression:               - Two 1 to 2 mm polyps in the transverse colon and                            in the ascending colon, removed with a cold biopsy                            forceps.  Resected and retrieved.                           - Diverticulosis in the sigmoid colon.                           - One 7 mm polyp in the transverse colon, removed                            with a cold snare. Resected and retrieved.                           - Non-bleeding external and internal hemorrhoids. Recommendation:           - Patient has a contact number available for                            emergencies. The signs and symptoms of potential  delayed complications were discussed with the                            patient. Return to normal activities tomorrow.                            Written discharge instructions were provided to the                            patient.                           - Resume previous diet.                           - Continue present medications.                           - Await pathology results.                           - Repeat colonoscopy in 3 - 5 years for                            surveillance based on pathology results. Mauri Pole, MD 12/30/2020 9:06:48 AM This report has been signed electronically.

## 2020-12-30 NOTE — Progress Notes (Signed)
Called to room to assist during endoscopic procedure.  Patient ID and intended procedure confirmed with present staff. Received instructions for my participation in the procedure from the performing physician.  

## 2020-12-30 NOTE — Patient Instructions (Addendum)
Handouts given on hiatal hernia, esophagitis, diverticulosis, hemorrhoids, and polyps given. Await pathology results. Continue your Prilosec 40 mg daily. Follow and antireflux regimen. Repeat colonoscopy in 3-5 years for surveillance based on pathology results.   YOU HAD AN ENDOSCOPIC PROCEDURE TODAY AT [facility]:   Refer to the procedure report that was given to you for any specific questions about what was found during the examination.  If the procedure report does not answer your questions, please call your gastroenterologist to clarify.  If you requested that your care partner not be given the details of your procedure findings, then the procedure report has been included in a sealed envelope for you to review at your convenience later.  YOU SHOULD EXPECT: Some feelings of bloating in the abdomen. Passage of more gas than usual.  Walking can help get rid of the air that was put into your GI tract during the procedure and reduce the bloating. If you had a lower endoscopy (such as a colonoscopy or flexible sigmoidoscopy) you may notice spotting of blood in your stool or on the toilet paper. If you underwent a bowel prep for your procedure, you may not have a normal bowel movement for a few days.  Please Note:  You might notice some irritation and congestion in your nose or some drainage.  This is from the oxygen used during your procedure.  There is no need for concern and it should clear up in a day or so.  SYMPTOMS TO REPORT IMMEDIATELY:  Following lower endoscopy (colonoscopy or flexible sigmoidoscopy):  Excessive amounts of blood in the stool  Significant tenderness or worsening of abdominal pains  Swelling of the abdomen that is new, acute  Fever of 100F or higher  Following upper endoscopy (EGD)  Vomiting of blood or coffee ground material  New chest pain or pain under the shoulder blades  Painful or persistently difficult swallowing  New shortness of breath  Fever  of 100F or higher  Black, tarry-looking stools  For urgent or emergent issues, a gastroenterologist can be reached at any hour by calling [phone]. Do not use MyChart messaging for urgent concerns.    DIET:  We do recommend a small meal at first, but then you may proceed to your regular diet.  Drink plenty of fluids but you should avoid alcoholic beverages for 24 hours.  ACTIVITY:  You should plan to take it easy for the rest of today and you should NOT DRIVE or use heavy machinery until tomorrow (because of the sedation medicines used during the test).    FOLLOW UP: Our staff will call the number listed on your records 48-72 hours following your procedure to check on you and address any questions or concerns that you may have regarding the information given to you following your procedure. If we do not reach you, we will leave a message.  We will attempt to reach you two times.  During this call, we will ask if you have developed any symptoms of COVID 19. If you develop any symptoms (ie: fever, flu-like symptoms, shortness of breath, cough etc.) before then, please call [phone].  If you test positive for Covid 19 in the 2 weeks post procedure, please call and report this information to [place].    If any biopsies were taken you will be contacted by phone or by letter within the next 1-3 weeks.  Please call us at [phone] if you have not heard about the biopsies in 3  weeks.    SIGNATURES/CONFIDENTIALITY: You and/or your care partner have signed paperwork which will be entered into your electronic medical record.  These signatures attest to the fact that that the information above on your After Visit Summary has been reviewed and is understood.  Full responsibility of the confidentiality of this discharge information lies with you and/or your care-partner.

## 2020-12-30 NOTE — Op Note (Signed)
[place] Patient Name: Rachel Mccarty Procedure Date: 12/30/2020 7:28 AM MRN: [id number] Endoscopist: Mauri Pole , MD Age: 60 Referring MD:  Date of Birth: [birth year] Gender: Female Account #: [id number] Procedure:                Upper GI endoscopy Indications:              Dysphagia Medicines:                Monitored Anesthesia Care Procedure:                Pre-Anesthesia Assessment:                           - Prior to the procedure, a History and Physical                            was performed, and patient medications and                            allergies were reviewed. The patient's tolerance of                            previous anesthesia was also reviewed. The risks                            and benefits of the procedure and the sedation                            options and risks were discussed with the patient.                            All questions were answered, and informed consent                            was obtained. Prior Anticoagulants: The patient has                            taken no previous anticoagulant or antiplatelet                            agents. ASA Grade Assessment: II - A patient with                            mild systemic disease. After reviewing the risks                            and benefits, the patient was deemed in                            satisfactory condition to undergo the procedure.                           After obtaining informed consent, the endoscope was  passed under direct vision. Throughout the                            procedure, the patient's blood pressure, pulse, and                            oxygen saturations were monitored continuously. The                            GIF [id number] was introduced through the                            mouth, and advanced to the second part of duodenum.                            The upper GI endoscopy was accomplished  without                            difficulty. The patient tolerated the procedure                            well. Scope In: Scope Out: Findings:                 No endoscopic abnormality was evident in the                            esophagus to explain the patient's complaint of                            dysphagia other than erosive esophagitis.                           LA Grade C (one or more mucosal breaks continuous                            between tops of 2 or more mucosal folds, less than                            75% circumference) esophagitis with no bleeding was                            found 34 to 36 cm from the incisors. Biopsies were                            taken with a cold forceps for histology.                           The Z-line was regular and was found 36 cm from the                            incisors.  The gastroesophageal flap valve was visualized                            endoscopically and classified as Hill Grade IV (no                            fold, wide open lumen, hiatal hernia present).                           A small hiatal hernia was present.                           The cardia and gastric fundus were normal on                            retroflexion.                           The examined duodenum was normal. Complications:            No immediate complications. Estimated Blood Loss:     Estimated blood loss was minimal. Impression:               - LA Grade C reflux esophagitis with no bleeding.                            Biopsied.                           - Z-line regular, 36 cm from the incisors.                           - Gastroesophageal flap valve classified as Hill                            Grade IV (no fold, wide open lumen, hiatal hernia                            present).                           - Small hiatal hernia.                           - Normal examined duodenum. Recommendation:            - Resume previous diet.                           - Continue present medications.                           - Await pathology results.                           - Follow an antireflux regimen.                           -  Continue Prilosec (omeprazole) 40 mg PO daily. Mauri Pole, MD 12/30/2020 8:56:22 AM This report has been signed electronically.

## 2020-12-30 NOTE — Progress Notes (Signed)
[facility] History and Physical   Primary Care Physician:  Aletha Halim., PA-C   Reason for Procedure:  Dysphagia, abdominal discomfort, colon cancer screening  Plan:    EGD and colonoscopy with possible interventions as needed     HPI: Rachel Mccarty is a very pleasant 60 y.o. female here for EGD and colonoscopy Please see office visit note 08/31/20 for additional details. Denies any nausea, vomiting, melena or bright red blood per rectum  The risks and benefits as well as alternatives of endoscopic procedure(s) have been discussed and reviewed. All questions answered. The patient agrees to proceed.    Past Medical History:  Diagnosis Date   Acid reflux    Hyperlipidemia    Hypertension    Palpitations     Past Surgical History:  Procedure Laterality Date   COLONOSCOPY  2013   OVARIAN CYST SURGERY     UPPER GI ENDOSCOPY  2018    Prior to Admission medications   Medication Sig Start Date End Date Taking? Authorizing Provider  atenolol (TENORMIN) 50 MG tablet TAKE 1 TABLET BY MOUTH EVERY DAY 09/15/20  Yes Patwardhan, Manish J, MD  fluticasone (FLONASE) 50 MCG/ACT nasal spray Place 2 sprays into both nostrils daily. 05/03/19  Yes [provider]  Multiple Vitamin tablet Take 1 tablet by mouth daily.   Yes [provider]  omeprazole (PRILOSEC) 40 MG capsule Take 40 mg by mouth daily.   Yes [provider]  ALPRAZolam (XANAX) 0.25 MG tablet Take 0.125-0.25 mg by mouth 2 (two) times daily as needed. 07/22/19   [provider]  diltiazem (CARDIZEM CD) 120 MG 24 hr capsule Take 1 capsule (120 mg total) by mouth daily. 08/26/20 11/24/20  Nigel Mormon, MD    Current Outpatient Medications  Medication Sig Dispense Refill   atenolol (TENORMIN) 50 MG tablet TAKE 1 TABLET BY MOUTH EVERY DAY 90 tablet 3   fluticasone (FLONASE) 50 MCG/ACT nasal spray Place 2 sprays into both nostrils daily.     Multiple Vitamin tablet Take 1  tablet by mouth daily.     omeprazole (PRILOSEC) 40 MG capsule Take 40 mg by mouth daily.     ALPRAZolam (XANAX) 0.25 MG tablet Take 0.125-0.25 mg by mouth 2 (two) times daily as needed.     diltiazem (CARDIZEM CD) 120 MG 24 hr capsule Take 1 capsule (120 mg total) by mouth daily. 30 capsule 3   Current Facility-Administered Medications  Medication Dose Route Frequency Provider Last Rate Last Admin   0.9 %  sodium chloride infusion  500 mL Intravenous Once Mauri Pole, MD        Allergies as of 12/30/2020   (No Known Allergies)    Family History  Problem Relation Age of Onset   Sudden Cardiac Death Mother    Dementia Mother    High blood pressure Father    Heart attack Maternal Grandmother    Stroke Maternal Grandfather    Colon cancer Neg Hx    Pancreatic cancer Neg Hx    Esophageal cancer Neg Hx    Liver disease Neg Hx    Stomach cancer Neg Hx     Social History   Socioeconomic History   Marital status: Married    Spouse name: Not on file   Number of children: 2   Years of education: Not on file   Highest education level: Not on file  Occupational History   Not on file  Tobacco Use   Smoking  status: Never   Smokeless tobacco: Never  Vaping Use   Vaping Use: Never used  Substance and Sexual Activity   Alcohol use: Yes    Alcohol/week: 1.0 standard drink    Types: 1 Cans of beer per week    Comment: 1 drink per day   Drug use: Never   Sexual activity: Not on file  Other Topics Concern   Not on file  Social History Narrative   Not on file   Social Determinants of Health   Financial Resource Strain: Not on file  Food Insecurity: Not on file  Transportation Needs: Not on file  Physical Activity: Not on file  Stress: Not on file  Social Connections: Not on file  Intimate Partner Violence: Not on file    Review of Systems:  All other review of systems negative except as mentioned in the HPI.  Physical Exam: Vital signs in last 24 hours: BP  128/84   Pulse 63   Temp 98.2 F (36.8 C) (Skin)   Resp 16   Ht '5\' 5"'$  (1.651 m)   Wt 145 lb (65.8 kg)   SpO2 100%   BMI 24.13 kg/m     General:   Alert, NAD Lungs:  Clear .   Heart:  Regular rate and rhythm Abdomen:  Soft, nontender and nondistended. Neuro/Psych:  Alert and cooperative. Normal mood and affect. A and O x 3  Reviewed labs, radiology imaging, old records and pertinent past GI work up  Patient is appropriate for planned procedure(s) and anesthesia in an ambulatory setting   K. Denzil Magnuson , MD [phone]

## 2020-12-30 NOTE — Progress Notes (Signed)
Pt in recovery with monitors in place, VSS. Report given to receiving RN. Bite guard was placed with pt awake to ensure comfort. No dental or soft tissue damage noted. RN will remove the guard when the pt is awake.  

## 2020-12-30 NOTE — Progress Notes (Signed)
Pt's states no medical or surgical changes since previsit or office visit. VS assessed by N.[facility] ?

## 2021-01-01 ENCOUNTER — Telehealth: Payer: Self-pay

## 2021-01-01 NOTE — Telephone Encounter (Signed)
  Follow up Call-  Call back number 12/30/2020  Post procedure Call Back phone  # [phone]  Permission to leave phone message Yes  Some recent data might be hidden     Patient questions:  Do you have a fever, pain , or abdominal swelling? No. Pain Score  0 *  Have you tolerated food without any problems? Yes.    Have you been able to return to your normal activities? Yes.    Do you have any questions about your discharge instructions: Diet   No. Medications  No. Follow up visit  No.  Do you have questions or concerns about your Care? No.  Actions: * If pain score is 4 or above: No action needed, pain <4.

## 2021-01-07 ENCOUNTER — Encounter: Payer: Self-pay | Admitting: Gastroenterology

## 2021-02-16 ENCOUNTER — Other Ambulatory Visit: Payer: Self-pay | Admitting: Cardiology

## 2021-02-16 DIAGNOSIS — I4729 Other ventricular tachycardia: Secondary | ICD-10-CM

## 2021-07-29 ENCOUNTER — Other Ambulatory Visit: Payer: Self-pay | Admitting: Cardiology

## 2021-07-29 DIAGNOSIS — I10 Essential (primary) hypertension: Secondary | ICD-10-CM

## 2021-07-29 DIAGNOSIS — I4729 Other ventricular tachycardia: Secondary | ICD-10-CM

## 2021-09-09 ENCOUNTER — Ambulatory Visit: Payer: 59

## 2021-09-09 DIAGNOSIS — I351 Nonrheumatic aortic (valve) insufficiency: Secondary | ICD-10-CM

## 2021-09-14 NOTE — Progress Notes (Signed)
I am not sure that it is tricuspid. My suspicion for bicuspid or abnormal left leaflet is high

## 2021-09-14 NOTE — Progress Notes (Signed)
Prior echo the AV was not well visualized as well. I finalized it by mistake before putting the comparison. I reviewed multiple studies, I am not sure this is not bicuspid

## 2021-10-15 ENCOUNTER — Ambulatory Visit: Payer: 59 | Admitting: Cardiology

## 2021-10-20 ENCOUNTER — Encounter: Payer: Self-pay | Admitting: Cardiology

## 2021-10-20 ENCOUNTER — Ambulatory Visit: Payer: 59 | Admitting: Cardiology

## 2021-10-20 VITALS — BP 137/87 | HR 61 | Temp 97.2°F | Resp 16 | Ht 65.0 in | Wt 159.8 lb

## 2021-10-20 DIAGNOSIS — I4729 Other ventricular tachycardia: Secondary | ICD-10-CM

## 2021-10-20 DIAGNOSIS — I10 Essential (primary) hypertension: Secondary | ICD-10-CM

## 2021-10-20 DIAGNOSIS — E782 Mixed hyperlipidemia: Secondary | ICD-10-CM

## 2021-10-20 DIAGNOSIS — I351 Nonrheumatic aortic (valve) insufficiency: Secondary | ICD-10-CM

## 2021-10-20 NOTE — Progress Notes (Signed)
Follow up visit  Subjective:   Rachel Mccarty, female    DOB: 05/04/1960, 61 y.o.   MRN: [id number]   HPI  Chief Complaint  Patient presents with   nonsustained ventricular tachycardia   Follow-up    61 y.o. Caucasian female with hypertension, hyperlipidemia, mod MR, NSVT.  Patient is doing well, denies chest pain, shortness of breath, palpitations, leg edema, orthopnea, PND, TIA/syncope. Reviewed recent test results with the patient, details below.     Current Outpatient Medications:    ALPRAZolam (XANAX) 0.25 MG tablet, Take 0.125-0.25 mg by mouth 2 (two) times daily as needed., Disp: , Rfl:    atenolol (TENORMIN) 50 MG tablet, TAKE 1 TABLET BY MOUTH EVERY DAY, Disp: 90 tablet, Rfl: 3   diltiazem (CARDIZEM CD) 120 MG 24 hr capsule, TAKE 1 CAPSULE BY MOUTH EVERY DAY, Disp: 90 capsule, Rfl: 1   fluticasone (FLONASE) 50 MCG/ACT nasal spray, Place 2 sprays into both nostrils daily., Disp: , Rfl:    Multiple Vitamin tablet, Take 1 tablet by mouth daily., Disp: , Rfl:    omeprazole (PRILOSEC) 40 MG capsule, Take 40 mg by mouth daily., Disp: , Rfl:   Cardiovascular & other pertient studies:  EKG 10/20/2021: Sinus rhythm 55 bpm Low voltage in precordial leads Otherwise normal EKG  Echocardiogram 09/09/2021:    Normal LV systolic function with EF 57%. Left ventricle cavity is normal  in size. Normal global wall motion. Normal diastolic filling pattern.  Calculated EF 57%.  The aortic valve is probably Bicuspid, but I cannot exclude tricuspid  valve with abnormally small left coronary cusp.  Mild to moderate aortic  regurgitation. Consider TEE for diagnosis.  Borderline prolapse of the mitral valve leaflets. Mild (Grade I) mitral  regurgitation.  Event monitor 09/24/2019 - 10/23/2019: Diagnostic time: 88%  Dominant rhythm: Sinus. HR 50-130 bpm. Avg HR 79 bpm. 1 auto detected event shows 7 beat NSVT (09/28/19 11:44 PM CST) Patient triggered events show sinus rhythm/  tachycardia No atrial fibrillation/atrial flutter/SVT/high grade AV block, sinus pause >3sec noted.  Recent labs: 08/02/2021: Glucose 93, BUN/Cr 10/0.88. EGFR 75. Na/K 136/4.0. Rest of the CMP normal H/H 13/38. MCV 90. Platelets 182 HbA1C NA% Chol 228, TG 81, HDL 57, LDL 153 TSH 1.1 normal  Review of Systems  Cardiovascular:  Negative for chest pain, dyspnea on exertion, leg swelling, palpitations and syncope.       Vitals:   10/20/21 1406 10/20/21 1411  BP: (!) 150/93 137/87  Pulse: 61 61  Resp: 16   Temp: (!) 97.2 F (36.2 C)   SpO2: 98%     Body mass index is 26.59 kg/m. Filed Weights   10/20/21 1406  Weight: 159 lb 12.8 oz (72.5 kg)     Objective:   Physical Exam Vitals and nursing note reviewed.  Constitutional:      General: She is not in acute distress. Neck:     Vascular: No JVD.  Cardiovascular:     Rate and Rhythm: Normal rate and regular rhythm.     Heart sounds: Murmur heard.     Diastolic murmur is present.  Pulmonary:     Effort: Pulmonary effort is normal.     Breath sounds: Normal breath sounds. No wheezing or rales.  Musculoskeletal:     Right lower leg: No edema.     Left lower leg: No edema.           Assessment & Recommendations:   61 y.o. Caucasian female with hypertension,  hyperlipidemia, mod MR, NSVT.  NSVT/palpitations: Well controlled on diltiazem 120 mg daily, atenolol 50 mg daily  Aortic regurgitation: Mild to moderate. Reviewed recent echocardiogram and compared with older ones. It is possible that she may have a small 1/3 coronary cusps, if not true bicspid aortic valve. Clinically asymptomatic. Ascending aorta upper limit normal around 3.9 cm. Repeat echocardiogram in 1 year, along with CTA aorta.  Mixed hyperlipidemia: Uncontrolled. Recommend diet and lifestyle modification. Calcium score 0.  She wants to hold off statin.  F/u in 1 year    Nigel Mormon, MD Pager: [phone] Office:  [phone]

## 2021-12-13 ENCOUNTER — Other Ambulatory Visit (INDEPENDENT_AMBULATORY_CARE_PROVIDER_SITE_OTHER): Payer: 59

## 2021-12-13 ENCOUNTER — Ambulatory Visit: Payer: 59 | Admitting: Nurse Practitioner

## 2021-12-13 ENCOUNTER — Encounter: Payer: Self-pay | Admitting: Nurse Practitioner

## 2021-12-13 VITALS — BP 160/86 | HR 60 | Ht 65.0 in | Wt 159.2 lb

## 2021-12-13 DIAGNOSIS — K219 Gastro-esophageal reflux disease without esophagitis: Secondary | ICD-10-CM | POA: Diagnosis not present

## 2021-12-13 DIAGNOSIS — R140 Abdominal distension (gaseous): Secondary | ICD-10-CM

## 2021-12-13 DIAGNOSIS — R110 Nausea: Secondary | ICD-10-CM | POA: Diagnosis not present

## 2021-12-13 DIAGNOSIS — R142 Eructation: Secondary | ICD-10-CM

## 2021-12-13 LAB — COMPREHENSIVE METABOLIC PANEL
ALT: 23 U/L (ref 0–35)
AST: 21 U/L (ref 0–37)
Albumin: 4.7 g/dL (ref 3.5–5.2)
Alkaline Phosphatase: 54 U/L (ref 39–117)
BUN: 12 mg/dL (ref 6–23)
CO2: 28 mEq/L (ref 19–32)
Calcium: 9.9 mg/dL (ref 8.4–10.5)
Chloride: 102 mEq/L (ref 96–112)
Creatinine, Ser: 1.04 mg/dL (ref 0.40–1.20)
GFR: 57.95 mL/min — ABNORMAL LOW (ref >60.00)
Glucose, Bld: 90 mg/dL (ref 70–99)
Potassium: 3.9 mEq/L (ref 3.5–5.1)
Sodium: 139 mEq/L (ref 135–145)
Total Bilirubin: 0.8 mg/dL (ref 0.2–1.2)
Total Protein: 7.5 g/dL (ref 6.0–8.3)

## 2021-12-13 LAB — CBC WITH DIFFERENTIAL/PLATELET
Basophils Absolute: 0.1 10*3/uL (ref 0.0–0.1)
Basophils Relative: 1.4 % (ref 0.0–3.0)
Eosinophils Absolute: 0.1 10*3/uL (ref 0.0–0.7)
Eosinophils Relative: 2.3 % (ref 0.0–5.0)
HCT: 43.4 % (ref 36.0–46.0)
Hemoglobin: 14.7 g/dL (ref 12.0–15.0)
Lymphocytes Relative: 28.3 % (ref 12.0–46.0)
Lymphs Abs: 1.1 10*3/uL (ref 0.7–4.0)
MCHC: 33.9 g/dL (ref 30.0–36.0)
MCV: 90.2 fl (ref 78.0–100.0)
Monocytes Absolute: 0.3 10*3/uL (ref 0.1–1.0)
Monocytes Relative: 7.9 % (ref 3.0–12.0)
Neutro Abs: 2.4 10*3/uL (ref 1.4–7.7)
Neutrophils Relative %: 60.1 % (ref 43.0–77.0)
Platelets: 135 10*3/uL — ABNORMAL LOW (ref 150.0–400.0)
RBC: 4.81 Mil/uL (ref 3.87–5.11)
RDW: 12.8 % (ref 11.5–15.5)
WBC: 4 10*3/uL (ref 4.0–10.5)

## 2021-12-13 MED ORDER — FAMOTIDINE 20 MG PO TABS: 20.0000 mg | ORAL_TABLET | Freq: Two times a day (BID) | ORAL | 2 refills | Status: DC

## 2021-12-13 MED ORDER — HYOSCYAMINE SULFATE 0.125 MG SL SUBL: 0.1250 mg | SUBLINGUAL_TABLET | Freq: Three times a day (TID) | SUBLINGUAL | 0 refills | Status: DC | PRN

## 2021-12-13 NOTE — Progress Notes (Signed)
12/13/2021 Rachel Mccarty [id number] 15-Jan-1961   Chief Complaint: Gas issues  History of Present Illness: Rachel Mccarty is a 61 year old female with a past medical history of nonsustained ventricular tachycardia, aortic regurgitation, GERD and colon polyps. She if followed by Dr. Silverio Decamp. She complains of having cycles of gas pains which last for  2 to 3 weeks and occurs once every few months. She awakens in the morning and feels well but as soon as she puts her feet on the floor she feels gas building up.  She burps constantly and feels gas pain which radiates up into her esophagus and through to her back between her shoulder blades.  She has intermittent nausea without vomiting.  Her burping gets worse throughout the day.  She infrequently chews gum.  She denies drinking carbonated beverages.  No artificial sweeteners.  She eats a fairly healthy diet.  No heartburn or dysphagia.  No weight loss.  No fever, sweats or chills.  She reported having similar flares once every month or 2.  She denies having any issues with constipation and passes a normal brown bowel movement daily.  No rectal bleeding or black stools.  She underwent an EGD 12/30/2020 which showed grade C reflux esophagitis and the colonoscopy identified 2 tubular adenomatous polyps and one benign lymphoid polyp removed from the colon.  She takes Omeprazole 40 mg daily.  Her last gyn exam was 07/2021.    EGD 12/30/2020: - LA Grade C reflux esophagitis with no bleeding. Biopsied. - Z-line regular, 36 cm from the incisors. - Gastroesophageal flap valve classified as Hill Grade IV (no fold, wide open lumen, hiatal hernia present). - Small hiatal hernia. - Normal examined duodenum.  Colonoscopy 12/30/2020: - Two 1 to 2 mm polyps in the transverse colon and in the ascending colon, removed with a cold biopsy forceps. Resected and retrieved. - Diverticulosis in the sigmoid colon. - One 7 mm polyp in the transverse colon, removed with  a cold snare. Resected and retrieved. - Non-bleeding external and internal hemorrhoids. -Recall colonoscopy 5 years  1. Surgical [P], mid esophagus and distal esophagus - GASTROESOPHAGEAL MUCOSA WITH MILD INFLAMMATION CONSISTENT WITH REFLUX. - NO INTESTINAL METAPLASIA, DYSPLASIA OR CARCINOMA. 2. Surgical [P], colon, ascending, transverse, polyp (3) - TUBULAR ADENOMA (2). - NO HIGH-GRADE DYSPLASIA OR CARCINOMA. - COLONIC MUCOSA WITH BENIGN LYMPHOID AGGREGATE  Past Medical History:  Diagnosis Date   Acid reflux    Hyperlipidemia    Hypertension    Palpitations    Past Surgical History:  Procedure Laterality Date   COLONOSCOPY  2013   OVARIAN CYST SURGERY     UPPER GI ENDOSCOPY  2018   Current Outpatient Medications on File Prior to Visit  Medication Sig Dispense Refill   ALPRAZolam (XANAX) 0.25 MG tablet Take 0.125-0.25 mg by mouth 2 (two) times daily as needed.     atenolol (TENORMIN) 50 MG tablet TAKE 1 TABLET BY MOUTH EVERY DAY 90 tablet 3   diltiazem (CARDIZEM CD) 120 MG 24 hr capsule TAKE 1 CAPSULE BY MOUTH EVERY DAY 90 capsule 1   fluticasone (FLONASE) 50 MCG/ACT nasal spray Place 2 sprays into both nostrils daily.     Multiple Vitamin tablet Take 1 tablet by mouth daily.     omeprazole (PRILOSEC) 40 MG capsule Take 40 mg by mouth daily.     No current facility-administered medications on file prior to visit.   No Known Allergies  Current Medications, Allergies, Past Medical History, Past  Surgical History, Family History and Social History were reviewed in [employer] record.   Review of Systems:   Constitutional: Negative for fever, sweats, chills or weight loss.  Respiratory: Negative for shortness of breath.   Cardiovascular: Negative for chest pain, palpitations and leg swelling.  Gastrointestinal: See HPI.  Musculoskeletal: See HPI. Neurological: Negative for dizziness, headaches or paresthesias.   Physical Exam: BP (!) 160/86    Pulse 60   Ht '5\' 5"'$  (1.651 m)   Wt 159 lb 3.2 oz (72.2 kg)   BMI 26.49 kg/m  Wt Readings from Last 3 Encounters:  12/13/21 159 lb 3.2 oz (72.2 kg)  10/20/21 159 lb 12.8 oz (72.5 kg)  12/30/20 145 lb (65.8 kg)    General: 61 year old female in no acute distress. Head: Normocephalic and atraumatic. Eyes: No scleral icterus. Conjunctiva pink . Ears: Normal auditory acuity. Mouth: Dentition intact. No ulcers or lesions.  Lungs: Clear throughout to auscultation. Heart: Regular rate and rhythm, no murmur. Abdomen: Soft, nontender and nondistended. No masses or hepatomegaly. Normal bowel sounds x 4 quadrants.  Rectal: Deferred.  Musculoskeletal: Symmetrical with no gross deformities. Extremities: No edema. Neurological: Alert oriented x 4. No focal deficits.  Psychological: Alert and cooperative. Normal mood and affect  Assessment and Recommendations:  65) 61 year old female with increased burping with pain which radiated from the stomach, into the esophagus and to the upper mid back, cycles occur once every few months and lasts up to 3 weeks. Intermittent nausea without vomiting.  -CBC, CMP -RUQ sono to rule out gallstones -Consider CCK HIDA scan if RUQ sonogram negative -Avoid fatty/greasy foods -FODMAP Handout -Famotidine 20 mg twice daily -Gas-X 1 tab twice daily -Hyoscyamine 0.125 mg 1 tab sublingual every 8 hours as needed for pain -Patient to contact office if symptoms worsen  2) GERD.  EGD 12/2020 identified acid reflux without evidence of Barrett's esophagus. -Continue Omeprazole 40 mg daily -Famotidine as ordered above  3) History of colon polyps.  Colonoscopy 12/2020 identified 2 tubular adenomatous polyps and 1 benign lymphoid aggregate polyp removed from the colon -Next colonoscopy due 12/2025  Today's encounter was 25 minutes which included precharting, chart/result review, history/exam, face-to-face time used for counseling, formulating treatment plan with follow-up  and documentation.

## 2021-12-13 NOTE — Patient Instructions (Signed)
Please look over and follow the "FODMAP" diet attached to this after visit summary. _____________________________________________________  Your provider has requested that you go to the basement level for lab work before leaving today. Press "B" on the elevator. The lab is located at the first door on the left as you exit the elevator. _____________________________________________________  Rachel Mccarty have been scheduled for an abdominal ultrasound at [facility] (1st floor of hospital) on Friday, 12/17/21 at 8:30 am. Please arrive 15 minutes prior to your appointment for registration. Make certain not to have anything to eat or drink 6 hours prior to your appointment. Should you need to reschedule your appointment, please contact radiology at [phone]. This test typically takes about 30 minutes to perform. _____________________________________________________  We have sent the following medications to your pharmacy for you to pick up at your convenience: Hyoscyamine 0.125 mg- Dissolve under the tongue every 6 hours as needed _____________________________________________________  Continue omeprazole 40 mg every morning.  _____________________________________________________  _______________________________________________________  If you are age 61 or older, your body mass index should be between 23-30. Your Body mass index is 26.49 kg/m. If this is out of the aforementioned range listed, please consider follow up with your Primary Care Provider.  If you are age 17 or younger, your body mass index should be between 19-25. Your Body mass index is 26.49 kg/m. If this is out of the aformentioned range listed, please consider follow up with your Primary Care Provider.   ________________________________________________________  The [facility] providers would like to encourage you to use [facility] to communicate with providers for non-urgent requests or questions.  Due to long hold times on  the telephone, sending your provider a message by [facility] may be a faster and more efficient way to get a response.  Please allow 48 business hours for a response.  Please remember that this is for non-urgent requests.  _______________________________________________________  Due to recent changes in healthcare laws, you may see the results of your imaging and laboratory studies on MyChart before your provider has had a chance to review them.  We understand that in some cases there may be results that are confusing or concerning to you. Not all laboratory results come back in the same time frame and the provider may be waiting for multiple results in order to interpret others.  Please give [place] 48 hours in order for your provider to thoroughly review all the results before contacting the office for clarification of your results.

## 2021-12-17 ENCOUNTER — Ambulatory Visit (HOSPITAL_COMMUNITY)
Admission: RE | Admit: 2021-12-17 | Discharge: 2021-12-17 | Disposition: A | Discharge status: Home / Self Care | Payer: 59 | Source: Ambulatory Visit | Attending: Nurse Practitioner | Admitting: Nurse Practitioner

## 2021-12-17 DIAGNOSIS — R110 Nausea: Secondary | ICD-10-CM | POA: Diagnosis present

## 2021-12-17 DIAGNOSIS — K219 Gastro-esophageal reflux disease without esophagitis: Secondary | ICD-10-CM | POA: Diagnosis present

## 2021-12-17 DIAGNOSIS — R140 Abdominal distension (gaseous): Secondary | ICD-10-CM | POA: Diagnosis present

## 2021-12-17 DIAGNOSIS — R142 Eructation: Secondary | ICD-10-CM | POA: Diagnosis present

## 2021-12-26 ENCOUNTER — Other Ambulatory Visit: Payer: Self-pay | Admitting: Nurse Practitioner

## 2021-12-29 NOTE — Telephone Encounter (Signed)
Spoke to pt in regard to my chart message sent by pt: : Pt stated that her symptoms got better for a few days after recent office visit and starting the medication but yesterday and today her symptoms of gas and Burping are back as they were  before:  Please advise

## 2021-12-31 ENCOUNTER — Other Ambulatory Visit: Payer: Self-pay

## 2021-12-31 DIAGNOSIS — R140 Abdominal distension (gaseous): Secondary | ICD-10-CM

## 2021-12-31 DIAGNOSIS — R143 Flatulence: Secondary | ICD-10-CM

## 2021-12-31 DIAGNOSIS — R110 Nausea: Secondary | ICD-10-CM

## 2021-12-31 DIAGNOSIS — R142 Eructation: Secondary | ICD-10-CM

## 2021-12-31 NOTE — Telephone Encounter (Signed)
Rachel Mccarty per las office visit patient was instructed to do the following: -FODMAP Handout -Famotidine 20 mg twice daily -Gas-X 1 tab twice daily -Hyoscyamine 0.125 mg 1 tab sublingual every 8 hours as needed for pain   If she is having pain which radiates up and between the shoulder blades schedule her for a CCK HIDA scan to rule out gallbladder dyskinesia.  Otherwise, if she is having nausea, belching only send her for a gastric empty study to rule out gastroparesis.   Let me know outcome. Thx

## 2021-12-31 NOTE — Telephone Encounter (Signed)
Pt was contacted by phone and made aware of Carl Best NP recommendations: Pt stated that she is adhering to the instructions given at her last office visit: Pt stated that she is only having the excessive gas and belching;  Pt was scheduled for a gastric empty study to rule out gastroparesis  on 01/04/2022 at 8:00 AM at [facility] : Pt to arrive at 7:30 AM. Nothing to eat or drink 6 hours prior and remain off any GI medication 8 hours prior: Pt made aware Pt verbalized understanding with all questions answered.

## 2022-01-04 ENCOUNTER — Encounter (HOSPITAL_COMMUNITY): Payer: 59

## 2022-09-03 IMAGING — CT CT CARDIAC CORONARY ARTERY CALCIUM SCORE
3 series · 14 of 20 positions shown, 16 images · non-contrast
Comparison: None.

CLINICAL DATA: 60-year-old Caucasian female with history
hyperlipidemia and hypertension.

EXAM:
CT CARDIAC CORONARY ARTERY CALCIUM SCORE
TECHNIQUE: Non-contrast imaging through the heart was performed using
prospective ECG gating. Image post processing was performed on an
independent workstation, allowing for quantitative analysis of the
heart and coronary arteries. Note that this exam targets the heart
and the chest was not imaged in its entirety.

[Series 2: calcium scoring 2.00 qr36 bestdiast 70% hrt calciu · axial · 0.32mm/px · z∈[+1745,+1829]mm · 4 of 70 slices shown]
[im 14/70  vessel]
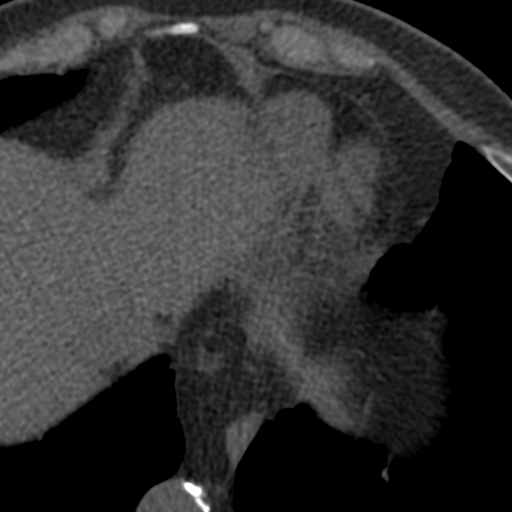
[im 28/70  vessel]
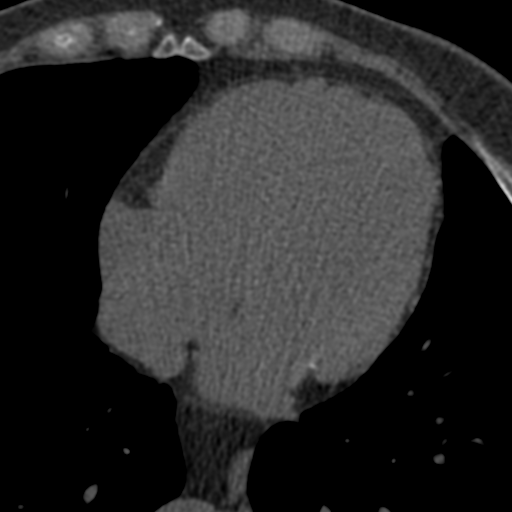
[im 42/70  vessel]
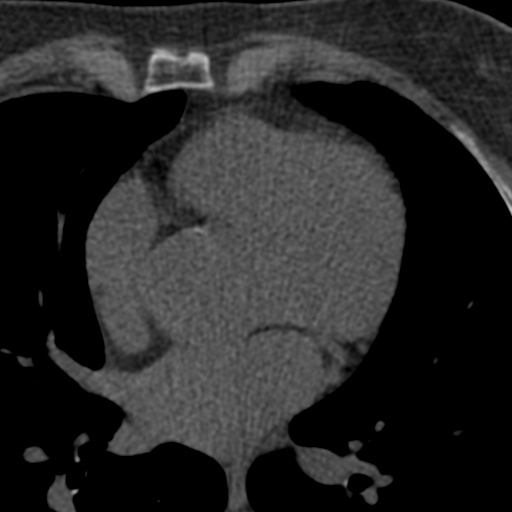
[im 56/70  vessel]
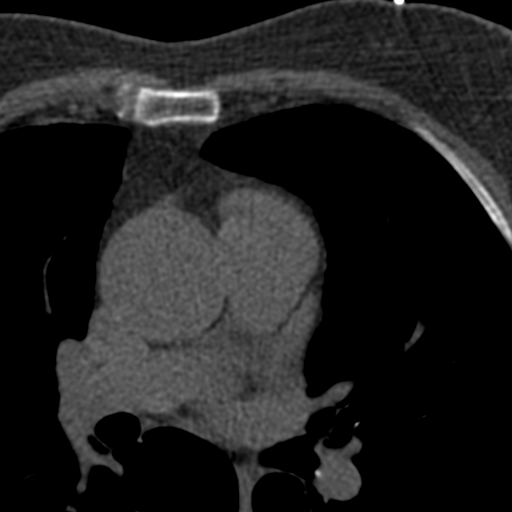

[Series 3: calcium scoring 2.00 br40 bestdiast 70% axial · axial · 0.65mm/px · z∈[+1741,+1833]mm · 5 of 70 slices shown, 7 images]
[im 12/70  vessel]
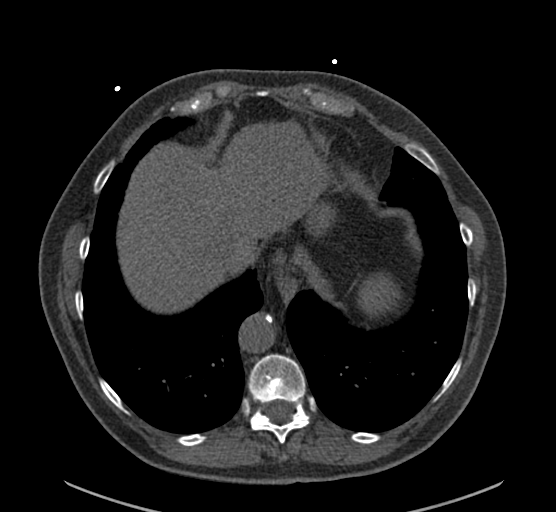
[im 12/70  lung]
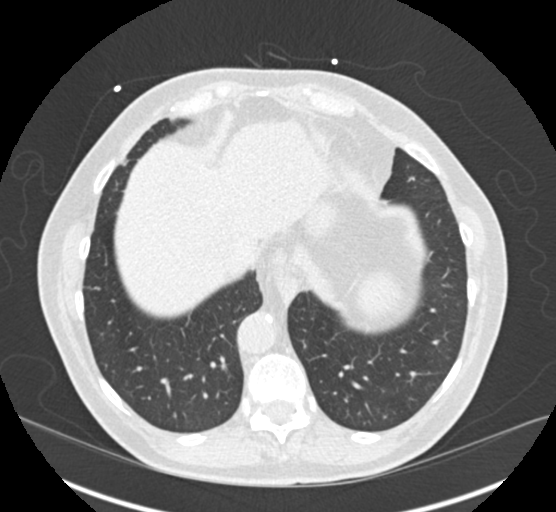
[im 24/70  vessel]
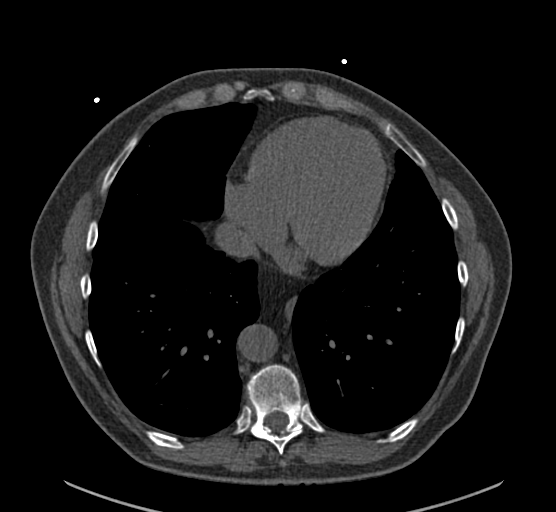
[im 35/70  vessel]
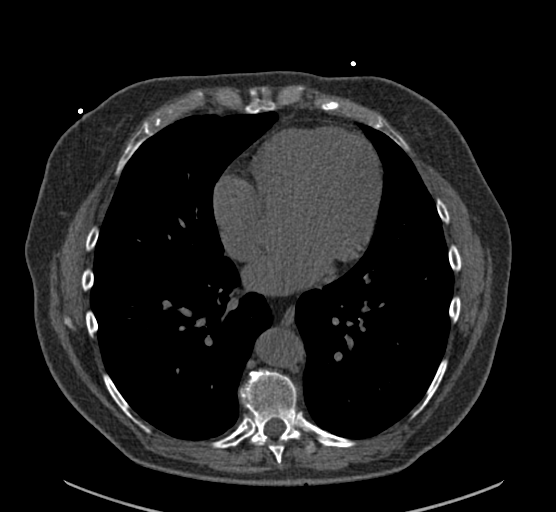
[im 47/70  vessel]
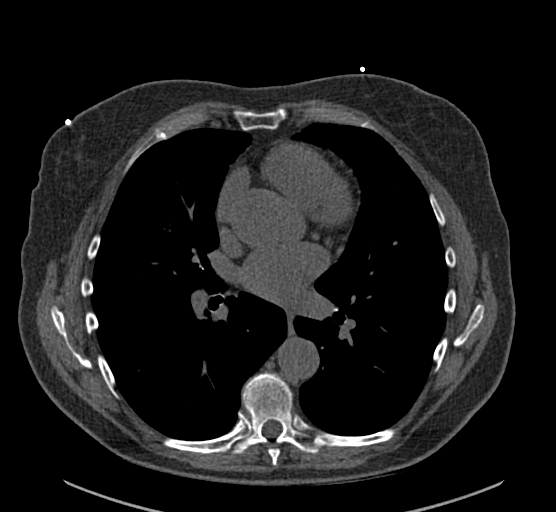
[im 58/70  vessel]
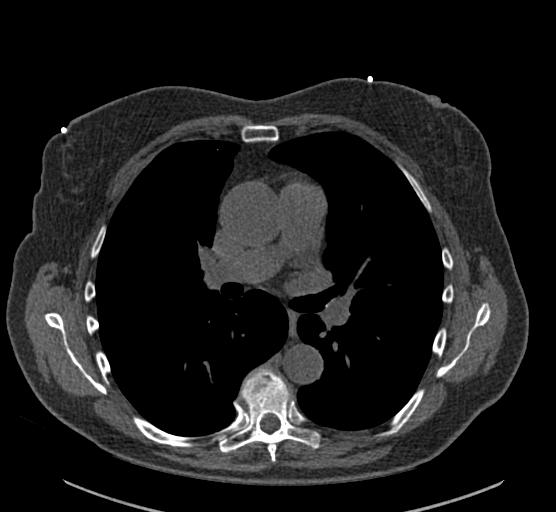
[im 58/70  lung]
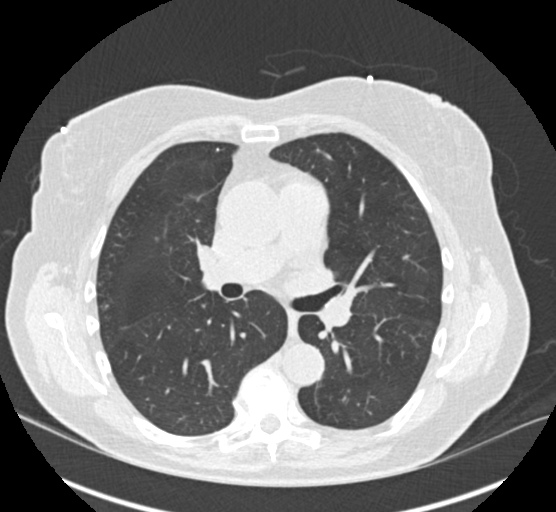

[Series 9: calcium scoring 2.00 br60 bestdiast 70% lungs · axial · 0.65mm/px · z∈[+1741,+1833]mm · 5 of 70 slices shown]
[im 12/70  vessel]
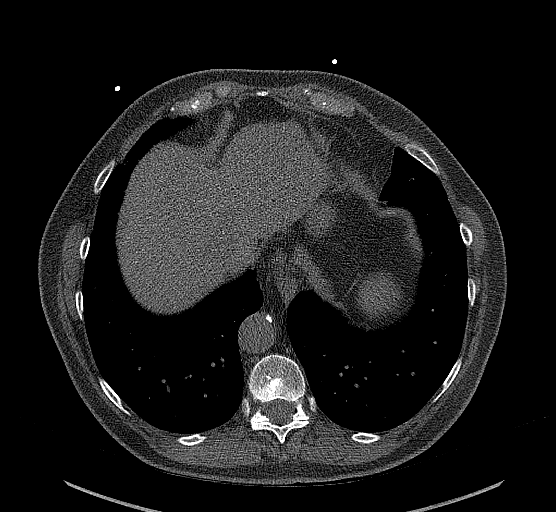
[im 24/70  vessel]
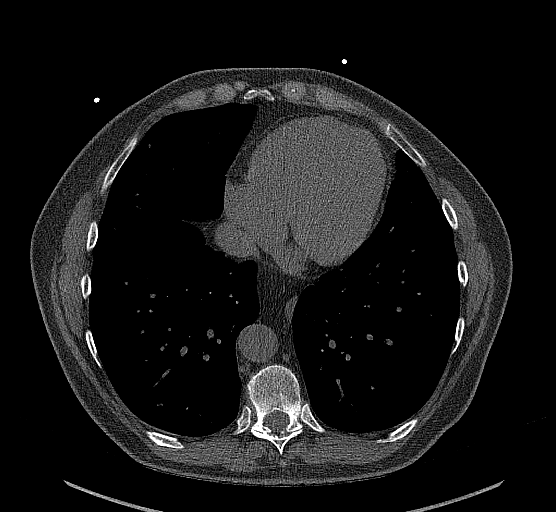
[im 35/70  vessel]
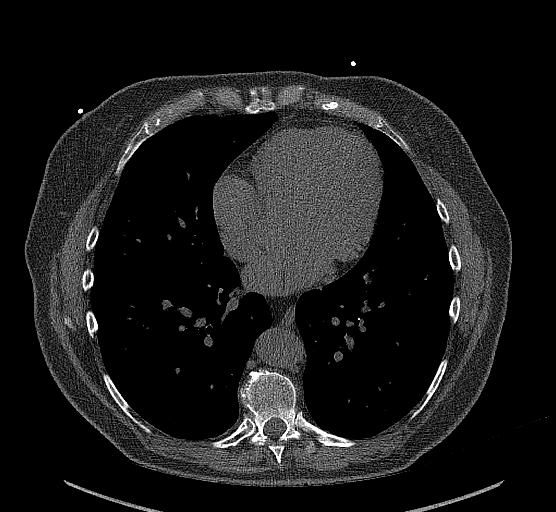
[im 47/70  vessel]
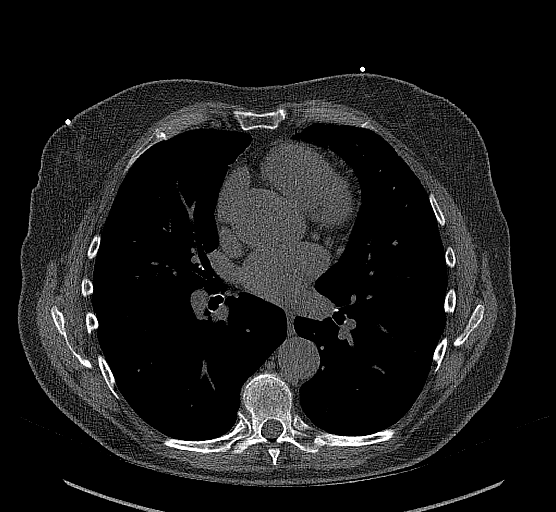
[im 58/70  vessel]
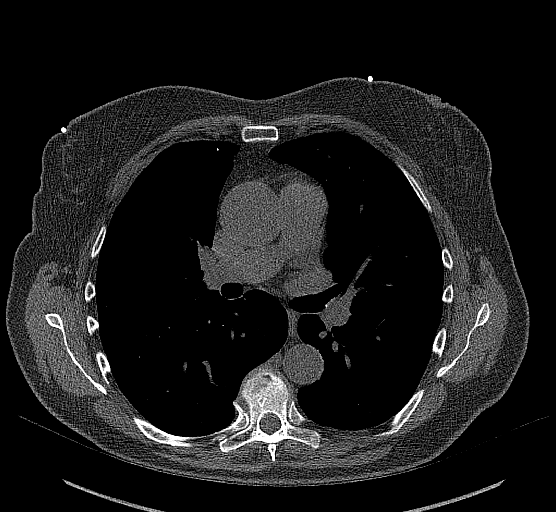

[14 of 20 positions shown; findings below may reference images not displayed]

FINDINGS: CORONARY CALCIUM SCORES:

Left Main: 0

LAD: 0

LCx: 0

RCA: 0

Total Agatston Score: 0

[HOSPITAL] percentile: 0

AORTA MEASUREMENTS:

Ascending Aorta: 39 mm

Descending Aorta: 24 mm

OTHER FINDINGS:

The heart size is within normal limits. No pericardial fluid is
identified. The ascending thoracic aorta is top-normal in caliber
and measures up to 3.9 cm by unenhanced CT. There is mild
atherosclerosis of the thoracic aorta with mild scattered calcified
plaque present. Visualized central pulmonary arteries are normal in
caliber. Visualized mediastinum and hilar regions demonstrate no
lymphadenopathy or masses. Visualized lungs show no evidence of
pulmonary edema, consolidation, pneumothorax, nodule or pleural
fluid. Visualized upper abdomen and bony structures are
unremarkable.
IMPRESSION: 1. Coronary calcium score of 0.
2. Top-normal caliber of the ascending thoracic aorta measuring up
to 3.9 cm. Correlation with echocardiography may be helpful to
evaluate aortic valve morphology and function.
3. Mild atherosclerosis of the thoracic aorta.

## 2022-09-15 ENCOUNTER — Other Ambulatory Visit: Payer: 59

## 2022-09-16 ENCOUNTER — Other Ambulatory Visit: Payer: Self-pay | Admitting: Cardiology

## 2022-09-16 ENCOUNTER — Other Ambulatory Visit: Payer: 59

## 2022-09-16 DIAGNOSIS — I351 Nonrheumatic aortic (valve) insufficiency: Secondary | ICD-10-CM

## 2022-09-20 ENCOUNTER — Ambulatory Visit: Payer: 59

## 2022-09-20 DIAGNOSIS — I351 Nonrheumatic aortic (valve) insufficiency: Secondary | ICD-10-CM

## 2022-09-23 NOTE — Progress Notes (Unsigned)
Follow up visit  Subjective:   Rachel Mccarty, female    DOB: 1960/10/29, 62 y.o.   MRN: [id number]   HPI  No chief complaint on file.   62 y.o. Caucasian female with hypertension, hyperlipidemia, mod MR, NSVT.  Patient is doing well, denies chest pain, shortness of breath, palpitations, leg edema, orthopnea, PND, TIA/syncope. Reviewed recent test results with the patient, details below.     Current Outpatient Medications:    ALPRAZolam (XANAX) 0.25 MG tablet, Take 0.125-0.25 mg by mouth 2 (two) times daily as needed., Disp: , Rfl:    atenolol (TENORMIN) 50 MG tablet, TAKE 1 TABLET BY MOUTH EVERY DAY, Disp: 90 tablet, Rfl: 3   diltiazem (CARDIZEM CD) 120 MG 24 hr capsule, TAKE 1 CAPSULE BY MOUTH EVERY DAY, Disp: 90 capsule, Rfl: 1   famotidine (PEPCID) 20 MG tablet, Take 1 tablet (20 mg total) by mouth 2 (two) times daily., Disp: 60 tablet, Rfl: 2   fluticasone (FLONASE) 50 MCG/ACT nasal spray, Place 2 sprays into both nostrils daily., Disp: , Rfl:    hyoscyamine (LEVSIN SL) 0.125 MG SL tablet, PLACE 1 TABLET (0.125 MG TOTAL) UNDER THE TONGUE EVERY 8 (EIGHT) HOURS AS NEEDED., Disp: 90 tablet, Rfl: 1   Multiple Vitamin tablet, Take 1 tablet by mouth daily., Disp: , Rfl:    omeprazole (PRILOSEC) 40 MG capsule, Take 40 mg by mouth daily., Disp: , Rfl:   Cardiovascular & other pertient studies:  EKG 10/20/2021: Sinus rhythm 55 bpm Low voltage in precordial leads Otherwise normal EKG  Echocardiogram 09/20/2022: Left ventricle cavity is normal in size. Normal left ventricular wall thickness. Normal global wall motion. Normal LV systolic function with EF 61%. Normal diastolic filling pattern.  Structurally normal trileaflet aortic valve. Trace aortic regurgitation. Mild (Grade I) mitral regurgitation. Mild tricuspid regurgitation.  No evidence of pulmonary hypertension. Previous study on 08/20/2021 had raised suspicion of bicuspid valve. However, this study clearly shows trileaflet  valve (best seen on image 19).   Event monitor 09/24/2019 - 10/23/2019: Diagnostic time: 88%  Dominant rhythm: Sinus. HR 50-130 bpm. Avg HR 79 bpm. 1 auto detected event shows 7 beat NSVT (09/28/19 11:44 PM CST) Patient triggered events show sinus rhythm/ tachycardia No atrial fibrillation/atrial flutter/SVT/high grade AV block, sinus pause >3sec noted.  Recent labs: 08/02/2021: Glucose 93, BUN/Cr 10/0.88. EGFR 75. Na/K 136/4.0. Rest of the CMP normal H/H 13/38. MCV 90. Platelets 182 HbA1C NA% Chol 228, TG 81, HDL 57, LDL 153 TSH 1.1 normal  Review of Systems  Cardiovascular:  Negative for chest pain, dyspnea on exertion, leg swelling, palpitations and syncope.       There were no vitals filed for this visit.   There is no height or weight on file to calculate BMI. There were no vitals filed for this visit.    Objective:   Physical Exam Vitals and nursing note reviewed.  Constitutional:      General: She is not in acute distress. Neck:     Vascular: No JVD.  Cardiovascular:     Rate and Rhythm: Normal rate and regular rhythm.     Heart sounds: Murmur heard.     Diastolic murmur is present.  Pulmonary:     Effort: Pulmonary effort is normal.     Breath sounds: Normal breath sounds. No wheezing or rales.  Musculoskeletal:     Right lower leg: No edema.     Left lower leg: No edema.  Assessment & Recommendations:   62 y.o. Caucasian female with hypertension, hyperlipidemia, mod MR, NSVT.  NSVT/palpitations: Well controlled on diltiazem 120 mg daily, atenolol 50 mg daily  Aortic regurgitation: Mild to moderate. Reviewed recent echocardiogram and compared with older ones. It is possible that she may have a small 1/3 coronary cusps, if not true bicspid aortic valve. Clinically asymptomatic. Ascending aorta upper limit normal around 3.9 cm. Repeat echocardiogram in 1 year, along with CTA aorta.  Mixed hyperlipidemia: Uncontrolled. Recommend diet and  lifestyle modification. Calcium score 0.  She wants to hold off statin.  F/u in 1 year    Elder Negus, MD Pager: [phone] Office: [phone]

## 2022-09-26 ENCOUNTER — Encounter: Payer: Self-pay | Admitting: Cardiology

## 2022-09-26 ENCOUNTER — Ambulatory Visit: Payer: 59 | Admitting: Cardiology

## 2022-09-26 VITALS — BP 147/96 | HR 63 | Resp 16 | Ht 65.0 in | Wt 168.0 lb

## 2022-09-26 DIAGNOSIS — I351 Nonrheumatic aortic (valve) insufficiency: Secondary | ICD-10-CM

## 2022-09-26 DIAGNOSIS — I10 Essential (primary) hypertension: Secondary | ICD-10-CM

## 2022-10-07 ENCOUNTER — Other Ambulatory Visit: Payer: Self-pay | Admitting: Cardiology

## 2022-10-07 DIAGNOSIS — I10 Essential (primary) hypertension: Secondary | ICD-10-CM

## 2022-10-07 DIAGNOSIS — I4729 Other ventricular tachycardia: Secondary | ICD-10-CM

## 2023-02-13 ENCOUNTER — Telehealth: Payer: Self-pay | Admitting: Cardiology

## 2023-02-13 NOTE — Telephone Encounter (Signed)
Notes. I see she's seeing Joni Reining, NP soon.  Thanks MJP

## 2023-02-13 NOTE — Telephone Encounter (Signed)
Pt c/o Shortness Of Breath: STAT if SOB developed within the last 24 hours or pt is noticeably SOB on the phone  1. Are you currently SOB (can you hear that pt is SOB on the phone)? Not at this time  2. How long have you been experiencing SOB?  Since last  Thiursday(02-09-23)- went to [facility] on yesterday(02-12-23)blood pressure is really high- last reading  3. Are you SOB when sitting or when up moving around? When she moves around   Patient says her blood pressure is really high  4.  Are you currently experiencing any other symptoms? Patient say her blood pressure is really high. She says today  it is 152/95, yesterday was 168/102.> She says she is having headaches also.She wanted to be seen- make her an appointment for Thursday with Joni Reining

## 2023-02-13 NOTE — Telephone Encounter (Signed)
Spoke with the patient who states that her home blood pressure readings have been higher than normal over the past several days. She is currently only taking atenolol 50 mg daily. She went to an urgent care yesterday but they did not adjust any of there medications. She is scheduled for an appointment with Joni Reining, PA on Thursday. She will continue to monitor at home and I will make Dr. Rosemary Holms aware.

## 2023-02-15 ENCOUNTER — Observation Stay (HOSPITAL_BASED_OUTPATIENT_CLINIC_OR_DEPARTMENT_OTHER): Payer: 59

## 2023-02-15 ENCOUNTER — Other Ambulatory Visit (HOSPITAL_COMMUNITY): Payer: Self-pay

## 2023-02-15 ENCOUNTER — Observation Stay (HOSPITAL_BASED_OUTPATIENT_CLINIC_OR_DEPARTMENT_OTHER)
Admission: EM | Admit: 2023-02-15 | Discharge: 2023-02-16 | Disposition: A | Discharge status: Home / Self Care | Payer: 59 | Attending: Family Medicine | Admitting: Family Medicine

## 2023-02-15 ENCOUNTER — Emergency Department (HOSPITAL_BASED_OUTPATIENT_CLINIC_OR_DEPARTMENT_OTHER): Payer: 59

## 2023-02-15 ENCOUNTER — Encounter (HOSPITAL_COMMUNITY): Payer: Self-pay | Admitting: Internal Medicine

## 2023-02-15 ENCOUNTER — Observation Stay (HOSPITAL_COMMUNITY): Payer: 59

## 2023-02-15 ENCOUNTER — Other Ambulatory Visit: Payer: Self-pay

## 2023-02-15 DIAGNOSIS — I4729 Other ventricular tachycardia: Secondary | ICD-10-CM | POA: Diagnosis not present

## 2023-02-15 DIAGNOSIS — R7989 Other specified abnormal findings of blood chemistry: Secondary | ICD-10-CM | POA: Diagnosis present

## 2023-02-15 DIAGNOSIS — Z86718 Personal history of other venous thrombosis and embolism: Secondary | ICD-10-CM | POA: Insufficient documentation

## 2023-02-15 DIAGNOSIS — Z86711 Personal history of pulmonary embolism: Secondary | ICD-10-CM | POA: Diagnosis present

## 2023-02-15 DIAGNOSIS — I2609 Other pulmonary embolism with acute cor pulmonale: Secondary | ICD-10-CM

## 2023-02-15 DIAGNOSIS — Z79899 Other long term (current) drug therapy: Secondary | ICD-10-CM | POA: Insufficient documentation

## 2023-02-15 DIAGNOSIS — K219 Gastro-esophageal reflux disease without esophagitis: Secondary | ICD-10-CM | POA: Diagnosis present

## 2023-02-15 DIAGNOSIS — D61818 Other pancytopenia: Secondary | ICD-10-CM | POA: Diagnosis present

## 2023-02-15 DIAGNOSIS — I10 Essential (primary) hypertension: Secondary | ICD-10-CM | POA: Diagnosis present

## 2023-02-15 DIAGNOSIS — I340 Nonrheumatic mitral (valve) insufficiency: Secondary | ICD-10-CM | POA: Diagnosis not present

## 2023-02-15 DIAGNOSIS — R0609 Other forms of dyspnea: Secondary | ICD-10-CM | POA: Diagnosis not present

## 2023-02-15 DIAGNOSIS — R0600 Dyspnea, unspecified: Secondary | ICD-10-CM | POA: Diagnosis not present

## 2023-02-15 DIAGNOSIS — Z23 Encounter for immunization: Secondary | ICD-10-CM | POA: Diagnosis not present

## 2023-02-15 DIAGNOSIS — R0602 Shortness of breath: Secondary | ICD-10-CM | POA: Diagnosis present

## 2023-02-15 LAB — CBC
HCT: 37.2 % (ref 36.0–46.0)
Hemoglobin: 13 g/dL (ref 12.0–15.0)
MCH: 31.7 pg (ref 26.0–34.0)
MCHC: 34.9 g/dL (ref 30.0–36.0)
MCV: 90.7 fL (ref 80.0–100.0)
Platelets: 113 10*3/uL — ABNORMAL LOW (ref 150–400)
RBC: 4.1 MIL/uL (ref 3.87–5.11)
RDW: 12.2 % (ref 11.5–15.5)
WBC: 3.7 10*3/uL — ABNORMAL LOW (ref 4.0–10.5)
nRBC: 0 % (ref 0.0–0.2)

## 2023-02-15 LAB — MAGNESIUM: Magnesium: 2.1 mg/dL (ref 1.7–2.4)

## 2023-02-15 LAB — COMPREHENSIVE METABOLIC PANEL
ALT: 15 U/L (ref 0–44)
AST: 17 U/L (ref 15–41)
Albumin: 4.4 g/dL (ref 3.5–5.0)
Alkaline Phosphatase: 50 U/L (ref 38–126)
Anion gap: 7 (ref 5–15)
BUN: 13 mg/dL (ref 8–23)
CO2: 26 mmol/L (ref 22–32)
Calcium: 9.7 mg/dL (ref 8.9–10.3)
Chloride: 104 mmol/L (ref 98–111)
Creatinine, Ser: 1 mg/dL (ref 0.44–1.00)
GFR, Estimated: >60 mL/min (ref >60)
Glucose, Bld: 102 mg/dL — ABNORMAL HIGH (ref 70–99)
Potassium: 4 mmol/L (ref 3.5–5.1)
Sodium: 137 mmol/L (ref 135–145)
Total Bilirubin: 0.7 mg/dL (ref 0.3–1.2)
Total Protein: 6.6 g/dL (ref 6.5–8.1)

## 2023-02-15 LAB — PHOSPHORUS: Phosphorus: 2.9 mg/dL (ref 2.5–4.6)

## 2023-02-15 LAB — ECHOCARDIOGRAM COMPLETE
Area-P 1/2: 2.75 cm2
Calc EF: 58.1 %
Height: 65 in
MV M vel: 3.52 m/s
MV Peak grad: 49.6 mm[Hg]
P 1/2 time: 460 ms
S' Lateral: 3.1 cm
Single Plane A2C EF: 53.8 %
Single Plane A4C EF: 61.3 %
Weight: 2657.6 [oz_av]

## 2023-02-15 LAB — TROPONIN I (HIGH SENSITIVITY)
Troponin I (High Sensitivity): 34 ng/L — ABNORMAL HIGH (ref <18)
Troponin I (High Sensitivity): 30 ng/L — ABNORMAL HIGH (ref <18)

## 2023-02-15 LAB — HIV ANTIBODY (ROUTINE TESTING W REFLEX): HIV Screen 4th Generation wRfx: NONREACTIVE

## 2023-02-15 LAB — TSH: TSH: 1.18 u[IU]/mL (ref 0.350–4.500)

## 2023-02-15 LAB — D-DIMER, QUANTITATIVE: D-Dimer, Quant: 2.75 ug{FEU}/mL — ABNORMAL HIGH (ref 0.00–0.50)

## 2023-02-15 LAB — BRAIN NATRIURETIC PEPTIDE: B Natriuretic Peptide: 41.6 pg/mL (ref 0.0–100.0)

## 2023-02-15 LAB — LIPASE, BLOOD: Lipase: 26 U/L (ref 11–51)

## 2023-02-15 MED ORDER — SODIUM CHLORIDE 0.9% FLUSH
3.0000 mL | Freq: Two times a day (BID) | INTRAVENOUS | Status: DC
  Administered 2023-02-15 – 2023-02-16 (×3): 3 mL via INTRAVENOUS

## 2023-02-15 MED ORDER — APIXABAN 5 MG PO TABS
10.0000 mg | ORAL_TABLET | Freq: Two times a day (BID) | ORAL | Status: DC
  Administered 2023-02-15 – 2023-02-16 (×2): 10 mg via ORAL
  Filled 2023-02-15 (×2): qty 2

## 2023-02-15 MED ORDER — ACETAMINOPHEN 325 MG PO TABS
650.0000 mg | ORAL_TABLET | Freq: Four times a day (QID) | ORAL | Status: DC | PRN
  Administered 2023-02-16: 650 mg via ORAL
  Filled 2023-02-15: qty 2

## 2023-02-15 MED ORDER — FLUTICASONE PROPIONATE 50 MCG/ACT NA SUSP: 2.0000 | Freq: Every day | NASAL | Status: DC | PRN

## 2023-02-15 MED ORDER — IOHEXOL 350 MG/ML SOLN
75.0000 mL | Freq: Once | INTRAVENOUS | Status: AC | PRN
  Administered 2023-02-15: 75 mL via INTRAVENOUS

## 2023-02-15 MED ORDER — ATENOLOL 25 MG PO TABS
50.0000 mg | ORAL_TABLET | Freq: Every day | ORAL | Status: DC
  Administered 2023-02-16: 50 mg via ORAL
  Filled 2023-02-15 (×2): qty 2

## 2023-02-15 MED ORDER — ALPRAZOLAM 0.25 MG PO TABS: 0.2500 mg | ORAL_TABLET | ORAL | Status: DC | PRN

## 2023-02-15 MED ORDER — ACETAMINOPHEN 650 MG RE SUPP: 650.0000 mg | Freq: Four times a day (QID) | RECTAL | Status: DC | PRN

## 2023-02-15 MED ORDER — ENOXAPARIN SODIUM 40 MG/0.4ML IJ SOSY: 40.0000 mg | PREFILLED_SYRINGE | INTRAMUSCULAR | Status: DC

## 2023-02-15 MED ORDER — HYDRALAZINE HCL 20 MG/ML IJ SOLN: 10.0000 mg | INTRAMUSCULAR | Status: DC | PRN

## 2023-02-15 MED ORDER — APIXABAN 5 MG PO TABS: 5.0000 mg | ORAL_TABLET | Freq: Two times a day (BID) | ORAL | Status: DC

## 2023-02-15 MED ORDER — PANTOPRAZOLE SODIUM 40 MG PO TBEC
40.0000 mg | DELAYED_RELEASE_TABLET | Freq: Every day | ORAL | Status: DC
  Administered 2023-02-16: 40 mg via ORAL
  Filled 2023-02-15: qty 1

## 2023-02-15 MED ORDER — ALBUTEROL SULFATE (2.5 MG/3ML) 0.083% IN NEBU: 2.5000 mg | INHALATION_SOLUTION | RESPIRATORY_TRACT | Status: DC | PRN

## 2023-02-15 MED ORDER — ENOXAPARIN SODIUM 80 MG/0.8ML IJ SOSY
75.0000 mg | PREFILLED_SYRINGE | Freq: Two times a day (BID) | INTRAMUSCULAR | Status: DC
  Administered 2023-02-15: 75 mg via SUBCUTANEOUS
  Filled 2023-02-15: qty 0.8

## 2023-02-15 MED ORDER — INFLUENZA VIRUS VACC SPLIT PF (FLUZONE) 0.5 ML IM SUSY
0.5000 mL | PREFILLED_SYRINGE | INTRAMUSCULAR | Status: AC
  Administered 2023-02-16: 0.5 mL via INTRAMUSCULAR
  Filled 2023-02-15: qty 0.5

## 2023-02-15 NOTE — Telephone Encounter (Signed)
Pt called cx appt because she is in the hospital at [facility]. She will c/b to r/s

## 2023-02-15 NOTE — Progress Notes (Addendum)
  History and Physical    Patient: Rachel Mccarty ION:[id number] DOB: 10-02-1960 DOA: 02/15/2023 DOS: the patient was seen and examined on 02/15/2023 PCP: Richmond Campbell., PA-C    CT angiogram of the chest noted moderate bilateral pulmonary emboli without significant signs of right heart strain.  Patient had already been started on Lovenox therapeutic dose we will transition to oral anticoagulation.  Orders also placed to check Doppler ultrasound of the lower extremities as she has a history of DVT previously.   Author: Clydie Braun, MD 02/15/2023 1:04 PM  For on call review [email or web address].

## 2023-02-15 NOTE — Progress Notes (Deleted)
  Cardiology Office Note:  .   Date:  02/15/2023  ID:  JULLIETTE HAGEMAN, DOB 1960/08/19, MRN [id number] PCP: Richmond Campbell PA-C  [facility] Providers Cardiologist:  Elder Negus, MD }   History of Present Illness: .   Rachel Mccarty is a 62 y.o. female ***  ROS: ***  Studies Reviewed: .        *** EKG Interpretation Date/Time:    Ventricular Rate:    PR Interval:    QRS Duration:    QT Interval:    QTC Calculation:   R Axis:      Text Interpretation:      Physical Exam:   VS:  There were no vitals taken for this visit.   Wt Readings from Last 3 Encounters:  02/15/23 166 lb 1.6 oz (75.3 kg)  09/26/22 168 lb (76.2 kg)  12/13/21 159 lb 3.2 oz (72.2 kg)    GEN: Well nourished, well developed in no acute distress NECK: No JVD; No carotid bruits CARDIAC: ***RRR, no murmurs, rubs, gallops RESPIRATORY:  Clear to auscultation without rales, wheezing or rhonchi  ABDOMEN: Soft, non-tender, non-distended EXTREMITIES:  No edema; No deformity   ASSESSMENT AND PLAN: .   ***    {Are you ordering a CV Procedure (e.g. stress test, cath, DCCV, TEE, etc)?   Press F2        :[id number]}    Signed, Bettey Mare. Liborio Nixon, ANP, AACC

## 2023-02-15 NOTE — ED Triage Notes (Signed)
Pt. Here POV from home c/o SOB and heart racing x 6 days; also c/o stomach issues for months

## 2023-02-15 NOTE — ED Notes (Signed)
Report given to [place] RN @ [facility]

## 2023-02-15 NOTE — TOC Benefit Eligibility Note (Signed)
Patient Product/process development scientist completed.    The patient is insured through [employer]. Patient has [employer], may use a copay card, and/or apply for patient assistance if available.    Ran test claim for Eliquis 5 mg and the current 30 day co-pay is $50.00.  Ran test claim for Xarelto 20 mg and the current 30 day co-pay is $50.00.  This test claim was processed through [facility]- copay amounts may vary at other pharmacies due to pharmacy/plan contracts, or as the patient moves through the different stages of their insurance plan.     Roland Earl, CPHT Pharmacy Technician III Certified Patient Advocate [facility] Patient Advocate Team Direct Number: [phone]  Fax: [phone]

## 2023-02-15 NOTE — ED Provider Notes (Addendum)
[facility] Emergency Department Provider Note MRN:  [id number]  Arrival date & time: 02/15/23     Chief Complaint   Shortness of Breath   History of Present Illness   Rachel Mccarty is a 62 y.o. year-old female with a history of hypertension, hyperlipidemia presenting to the ED with chief complaint of shortness of breath  Dyspnea on exertion for the past several days.  Very winded after simply walking to the bathroom which is abnormal for her.  Has been persistent during this time.  Also having some intermittent heart racing, has been having increased stomach issues, pressure fullness to the epigastrium/lower chest.  Frequent belching.  Normal bowel movements, no leg pain or swelling.  Review of Systems  A thorough review of systems was obtained and all systems are negative except as noted in the HPI and PMH.   Patient's Health History    Past Medical History:  Diagnosis Date   Acid reflux    Hyperlipidemia    Hypertension    Palpitations     Past Surgical History:  Procedure Laterality Date   COLONOSCOPY  2013   OVARIAN CYST SURGERY     UPPER GI ENDOSCOPY  2018    Family History  Problem Relation Age of Onset   Sudden Cardiac Death Mother    Dementia Mother    High blood pressure Father    Heart attack Maternal Grandmother    Stroke Maternal Grandfather    Colon cancer Neg Hx    Pancreatic cancer Neg Hx    Esophageal cancer Neg Hx    Liver disease Neg Hx    Stomach cancer Neg Hx     Social History   Socioeconomic History   Marital status: Married    Spouse name: Not on file   Number of children: 2   Years of education: Not on file   Highest education level: Not on file  Occupational History   Not on file  Tobacco Use   Smoking status: Never   Smokeless tobacco: Never  Vaping Use   Vaping status: Never Used  Substance and Sexual Activity   Alcohol use: Yes    Alcohol/week: 1.0 standard drink of alcohol    Types: 1 Cans of beer  per week    Comment: 1 drink per day   Drug use: Never   Sexual activity: Not on file  Other Topics Concern   Not on file  Social History Narrative   Not on file   Social Determinants of Health   Financial Resource Strain: Low Risk  (07/06/2020)   [facility] visits prior to 06/18/2022., [facility] visits prior to 06/18/2022.   Overall Financial Resource Strain (CARDIA)    Difficulty of Paying Living Expenses: Not hard at all  Food Insecurity: Low Risk  (08/08/2022)   [facility]   Hunger Vital Sign    Worried About Running Out of Food in the Last Year: Never true    Ran Out of Food in the Last Year: Never true  Transportation Needs: No Transportation Needs (08/08/2022)   [facility]   Transportation    In the past 12 months, has lack of reliable transportation kept you from medical appointments, meetings, work or from getting things needed for daily living? : No  Physical Activity: Insufficiently Active (07/06/2020)   Received from [facility] visits prior to 06/18/2022., [facility] visits prior to 06/18/2022.   Exercise Vital Sign    Days of Exercise per Week: 3 days    Minutes of Exercise per Session: 20 min  Stress: No Stress Concern Present (07/06/2020)   [facility] visits prior to 06/18/2022., [facility] visits prior to 06/18/2022.   [employer] of Occupational Health - Occupational Stress Questionnaire    Feeling of Stress : Only a little  Social Connections: Socially Isolated (07/06/2020)   Received from [facility] visits prior to 06/18/2022., [facility] visits prior to 06/18/2022.   Social Connection and Isolation Panel [NHANES]    Frequency of Communication with Friends and Family: More than three times a week     Frequency of Social Gatherings with Friends and Family: Three times a week    Attends Religious Services: Never    Active Member of Clubs or Organizations: No    Attends Banker Meetings: Never    Marital Status: Separated  Intimate Partner Violence: Not At Risk (07/06/2020)   Received from [facility] visits prior to 06/18/2022., [facility] visits prior to 06/18/2022.   Humiliation, Afraid, Rape, and Kick questionnaire    Fear of Current or Ex-Partner: No    Emotionally Abused: No    Physically Abused: No    Sexually Abused: No     Physical Exam   Vitals:   02/15/23 0530 02/15/23 0600  BP: (!) 127/94 (!) 135/92  Pulse: 72 71  Resp: 15 15  Temp:    SpO2: 96% 95%    CONSTITUTIONAL: Well-appearing, NAD NEURO/PSYCH:  Alert and oriented x 3, no focal deficits EYES:  eyes equal and reactive ENT/NECK:  no LAD, no JVD CARDIO: Regular rate, well-perfused, normal S1 and S2 PULM:  CTAB no wheezing or rhonchi GI/GU:  non-distended, non-tender MSK/SPINE:  No gross deformities, no edema SKIN:  no rash, atraumatic   *Additional and/or pertinent findings included in MDM below  Diagnostic and Interventional Summary    EKG Interpretation Date/Time:  Wednesday February 15 2023 05:17:30 EDT Ventricular Rate:  78 PR Interval:  149 QRS Duration:  85 QT Interval:  399 QTC Calculation: 455 R Axis:   17  Text Interpretation: Sinus rhythm Nonspecific T abnormalities, lateral leads Confirmed by Kennis Carina [phone]) on 02/15/2023 5:30:57 AM       Labs Reviewed  CBC - Abnormal; Notable for the following components:      Result Value   WBC 3.7 (*)    Platelets 113 (*)    All other components within normal limits  COMPREHENSIVE METABOLIC PANEL - Abnormal; Notable for the following components:   Glucose, Bld 102 (*)    All other components within normal limits  TROPONIN I (HIGH SENSITIVITY) - Abnormal; Notable for the following  components:   Troponin I (High Sensitivity) 34 (*)    All other components within normal limits  LIPASE, BLOOD  D-DIMER, QUANTITATIVE  BRAIN NATRIURETIC PEPTIDE    DG Chest Port 1 View  Final Result      Medications - No data to display   Procedures  /  Critical Care Procedures  ED Course and Medical Decision Making  Initial Impression and Ddx Differential diagnosis includes atypical presentation of ACS, viral illness, overall highly doubt PE, doubt pulmonary edema.  Sitting comfortably in no acute distress with normal vital signs.  Past medical/surgical history that  increases complexity of ED encounter: Hypertension, hyperlipidemia  Interpretation of Diagnostics I personally reviewed the EKG and my interpretation is as follows: Sinus rhythm, nonspecific findings  Labs without significant blood count or electrolyte disturbance.  Troponin is minimally elevated at 34  Patient Reassessment and Ultimate Disposition/Management     Given patient's brand-new dyspnea on exertion and newly elevated troponin will request hospitalist admission for further testing.  The elevated troponin increases the suspicion for PE, pulmonary edema though still felt to be unlikely.  Adding on D-dimer, BNP.  Patient management required discussion with the following services or consulting groups:  Hospitalist Service  Complexity of Problems Addressed Acute illness or injury that poses threat of life of bodily function  Additional Data Reviewed and Analyzed Further history obtained from: None  Additional Factors Impacting ED Encounter Risk Consideration of hospitalization  Elmer Sow. Pilar Plate, MD [facility] Emergency Medicine [facility] [email or web address]  Final Clinical Impressions(s) / ED Diagnoses     ICD-10-CM   1. Dyspnea on exertion  R06.09     2. Elevated troponin  R79.89       ED Discharge Orders     None        Discharge Instructions Discussed with and  Provided to Patient:   Discharge Instructions   None      Sabas Sous, MD 02/15/23 [id number]    Sabas Sous, MD 02/15/23 [phone]

## 2023-02-15 NOTE — Progress Notes (Signed)
  Echocardiogram 2D Echocardiogram has been performed.  Milda Smart 02/15/2023, 2:09 PM

## 2023-02-15 NOTE — ED Notes (Signed)
-  [facility] called for transportation to 6E30 at 647am.

## 2023-02-15 NOTE — H&P (Addendum)
History and Physical    Patient: Rachel Mccarty ZOX:[id number] DOB: 1961/03/12 DOA: 02/15/2023 DOS: the patient was seen and examined on 02/15/2023 PCP: Richmond Campbell., PA-C  Patient coming from: Home  Chief Complaint:  Chief Complaint  Patient presents with   Shortness of Breath   HPI: Rachel Mccarty is a 62 y.o. female with medical history significant of hypertension, hyperlipidemia, NSVT, mitral regurgitation, remote history of DVT, and GERD who presents with complaints of shortness of breath.  Symptoms started about 6 days ago and she initially only noted it with exertion.  She has had a mild intermittent nonproductive cough and palpitations.  Denies having any recent fever, nausea, vomiting, abdominal pain, or diarrhea.  She chronically reports having lower extremity with intermittent leg cramps.  Denies having any recent travel or prolonged immobilization.  Patient has been following with cardiology for palpitations and history of mitral valve regurgitation where she has had yearly echocardiograms.  Most recent echocardiogram done in June showed EF to be 61% with grade 1 mitral regurgitation, trace aortic regurgitation, and mild tricuspid regurgitation.  She makes note that she is worn Holter monitors twice and has been on atenolol for rate control.  Patient had previously been on Cardizem but self stopped the medication.  Patient notes remote history of DVT, but states it was secondary to OCP use.  Daughter also reports history of blood clot, but also thought to be provoked in the setting of surgery.  In the emergency department patient was noted to be afebrile with tachypnea, blood pressures elevated up to 152/106, and all other vital signs maintained.  Labs significant for WBC 3.7, platelets 113, BNP 41.6, D-dimer 2.75, and high-sensitivity troponin 34 ->30.  Chest x-ray showed no acute abnormality.  Review of Systems: As mentioned in the history of present illness. All other systems  reviewed and are negative. Past Medical History:  Diagnosis Date   Acid reflux    Hyperlipidemia    Hypertension    Palpitations    Past Surgical History:  Procedure Laterality Date   COLONOSCOPY  2013   OVARIAN CYST SURGERY     UPPER GI ENDOSCOPY  2018   Social History:  reports that she has never smoked. She has never used smokeless tobacco. She reports current alcohol use of about 1.0 standard drink of alcohol per week. She reports that she does not use drugs.  No Known Allergies  Family History  Problem Relation Age of Onset   Sudden Cardiac Death Mother    Dementia Mother    High blood pressure Father    Heart attack Maternal Grandmother    Stroke Maternal Grandfather    Colon cancer Neg Hx    Pancreatic cancer Neg Hx    Esophageal cancer Neg Hx    Liver disease Neg Hx    Stomach cancer Neg Hx     Prior to Admission medications   Medication Sig Start Date End Date Taking? Authorizing Provider  ALPRAZolam (XANAX) 0.25 MG tablet Take 0.125-0.25 mg by mouth 2 (two) times daily as needed. 07/22/19   [provider]  atenolol (TENORMIN) 50 MG tablet TAKE 1 TABLET BY MOUTH EVERY DAY 10/07/22   Patwardhan, Manish J, MD  diltiazem (CARDIZEM CD) 120 MG 24 hr capsule TAKE 1 CAPSULE BY MOUTH EVERY DAY Patient not taking: Reported on 09/26/2022 02/17/21   Elder Negus, MD  famotidine (PEPCID) 20 MG tablet Take 1 tablet (20 mg total) by mouth 2 (two) times daily. 12/13/21  Arnaldo Natal, NP  fluticasone (FLONASE) 50 MCG/ACT nasal spray Place 2 sprays into both nostrils daily. 05/03/19   [provider]  hyoscyamine (LEVSIN SL) 0.125 MG SL tablet PLACE 1 TABLET (0.125 MG TOTAL) UNDER THE TONGUE EVERY 8 (EIGHT) HOURS AS NEEDED. 12/27/21   Arnaldo Natal, NP  Multiple Vitamin tablet Take 1 tablet by mouth daily.    [provider]  omeprazole (PRILOSEC) 40 MG capsule Take 40 mg by mouth daily.    [provider]    Physical  Exam: Vitals:   02/15/23 0600 02/15/23 0645 02/15/23 0837 02/15/23 0842  BP: (!) 135/92 (!) 152/106  (!) 156/94  Pulse: 71 74  66  Resp: 15 14  17   Temp:    98.9 F (37.2 C)  TempSrc:    Oral  SpO2: 95% 98%  97%  Weight:   75.3 kg   Height:   5\' 5"  (1.651 m)     Constitutional: Older female who appears to be in no acute distress at this time Eyes: PERRL, lids and conjunctivae normal ENMT: Mucous membranes are moist. Posterior pharynx clear of any exudate or lesions.Normal dentition.  Neck: normal, supple, no masses, no JVD Respiratory: clear to auscultation bilaterally, no wheezing, no crackles. Normal respiratory effort. No accessory muscle use.  Cardiovascular: Regular rate and rhythm, no murmurs / rubs / gallops.  Trace lowerextremity edema. 2+ pedal pulses. No carotid bruits.  Abdomen: no tenderness, no masses palpated. No hepatosplenomegaly. Bowel sounds positive.  Musculoskeletal: no clubbing / cyanosis. No joint deformity upper and lower extremities. Good ROM, no contractures. Normal muscle tone.  Skin: no rashes, lesions, ulcers. No induration Neurologic: CN 2-12 grossly intact. vStrength 5/5 in all 4.  Psychiatric: Normal judgment and insight. Alert and oriented x 3. Normal mood.   Data Reviewed:  EKG reveals sinus rhythm at 78 bpm reviewed labs, imaging, and pertinent records as documented.  Assessment and Plan:  Dyspnea on exertion  History of DVT Acute.  Patient presents with 6-day history of shortness of breath with exertion.  Chest x-ray showed no acute abnormality.  Patient noted to have significant elevated D-dimer of 2.75.  Has remote history of DVT thought secondary to OCPs.  Concern for possible pulmonary embolism at this time. -Admit to a cardiac telemetry bed -Nasal cannula oxygen as needed to maintain O2 saturation greater than 92%. -Check TSH, phosphorus, magnesium -Check CT angiogram of the chest -Follow-up telemetry -Treatment dose of Lovenox per  pharmacy awaiting CT angiogram of the chest -Would likely need referral to hematology oncology for hypercoagulable workup  Elevated troponin Acute.  Patient denied any reports of chest pain.  High-sensitivity troponins were noted to be 34-> 30.  Patient with prior coronary CT done back in 2022 with calcium score of 0. -Check echocardiogram  History of NSVT Patient does report intermittently having palpitations.  Prior history of NSVT for which it appears she had been on Cardizem at some point in time. -Continue atenolol -Follow-up telemetry  Mitral regurgitation Noted to be mild on last echocardiogram from 09/2022 -Follow-up repeat echocardiogram  Pancytopenia Acute on chronic.  WBC 3.7 and platelet count 113.  White blood cell count and platelet count slightly lower than prior checks.  May warrant further workup in outpatient setting -Continue to monitor  Essential hypertension Blood pressures elevated up to 152/106. -Continue atenolol -Hydralazine IV as needed  GERD -Continue pharmacy substitution of Protonix   DVT prophylaxis: Lovenox Advance Care Planning:   Code Status: Full  Code    Consults: None  Family Communication: Daughter updated at bedside  Severity of Illness: The appropriate patient status for this patient is OBSERVATION. Observation status is judged to be reasonable and necessary in order to provide the required intensity of service to ensure the patient's safety. The patient's presenting symptoms, physical exam findings, and initial radiographic and laboratory data in the context of their medical condition is felt to place them at decreased risk for further clinical deterioration. Furthermore, it is anticipated that the patient will be medically stable for discharge from the hospital within 2 midnights of admission.   Author: Clydie Braun, MD 02/15/2023 8:52 AM  For on call review [email or web address].

## 2023-02-15 NOTE — ED Notes (Signed)
Pt states she has been SHOB x 6 days.  On/off palpitations.  Denies any CP

## 2023-02-15 NOTE — Progress Notes (Signed)
PHARMACY - ANTICOAGULATION CONSULT NOTE  Pharmacy Consult for Lovenox Indication: VTE treatment  No Known Allergies  Patient Measurements: Height: 5\' 5"  (165.1 cm) Weight: 75.3 kg (166 lb 1.6 oz) IBW/kg (Calculated) : 57  Vital Signs: Temp: 98.9 F (37.2 C) (10/30 0842) Temp Source: Oral (10/30 0842) BP: 156/94 (10/30 0842) Pulse Rate: 66 (10/30 0842)  Labs: Recent Labs    02/15/23 0525 02/15/23 0735  HGB 13.0  --   HCT 37.2  --   PLT 113*  --   CREATININE 1.00  --   TROPONINIHS 34* 30*    Estimated Creatinine Clearance: 59.2 mL/min (by C-G formula based on SCr of 1 mg/dL).   Medical History: Past Medical History:  Diagnosis Date   Acid reflux    Hyperlipidemia    Hypertension    Palpitations     Assessment: 62 yo female presenting with dyspnea on exertion, elevated troponin and D-dimer concern for PE.  Pharmacy consulted for Lovenox dosing.  Not on anticoagulation PTA.    Hgb 13.0, pltc 113.  CTA ordered.  Goal of Therapy:  Anti-Xa level 0.6-1 units/ml 4hrs after LMWH dose given Monitor platelets by anticoagulation protocol: Yes   Plan:  START Lovenox 75 mg Q12h (1 mg/kg) Monitor dqaily CBC, s/sx bleeding, renal function F/U imaging, transition to DOAC  Trixie Rude, PharmD Clinical Pharmacist 02/15/2023  10:22 AM

## 2023-02-16 ENCOUNTER — Ambulatory Visit: Payer: 59 | Admitting: Adult Health

## 2023-02-16 ENCOUNTER — Other Ambulatory Visit (HOSPITAL_COMMUNITY): Payer: Self-pay

## 2023-02-16 DIAGNOSIS — R0609 Other forms of dyspnea: Secondary | ICD-10-CM | POA: Diagnosis not present

## 2023-02-16 LAB — CBC
HCT: 39.7 % (ref 36.0–46.0)
Hemoglobin: 13.5 g/dL (ref 12.0–15.0)
MCH: 30.8 pg (ref 26.0–34.0)
MCHC: 34 g/dL (ref 30.0–36.0)
MCV: 90.6 fL (ref 80.0–100.0)
Platelets: 107 10*3/uL — ABNORMAL LOW (ref 150–400)
RBC: 4.38 MIL/uL (ref 3.87–5.11)
RDW: 12.1 % (ref 11.5–15.5)
WBC: 3.5 10*3/uL — ABNORMAL LOW (ref 4.0–10.5)
nRBC: 0 % (ref 0.0–0.2)

## 2023-02-16 LAB — BASIC METABOLIC PANEL
Anion gap: 9 (ref 5–15)
BUN: 10 mg/dL (ref 8–23)
CO2: 22 mmol/L (ref 22–32)
Calcium: 9.1 mg/dL (ref 8.9–10.3)
Chloride: 106 mmol/L (ref 98–111)
Creatinine, Ser: 1.01 mg/dL — ABNORMAL HIGH (ref 0.44–1.00)
GFR, Estimated: >60 mL/min (ref >60)
Glucose, Bld: 85 mg/dL (ref 70–99)
Potassium: 3.9 mmol/L (ref 3.5–5.1)
Sodium: 137 mmol/L (ref 135–145)

## 2023-02-16 MED ORDER — APIXABAN 5 MG PO TABS: 5.0000 mg | ORAL_TABLET | Freq: Two times a day (BID) | ORAL | 1 refills | Status: AC

## 2023-02-16 MED ORDER — APIXABAN (ELIQUIS) VTE STARTER PACK (10MG AND 5MG)
ORAL_TABLET | ORAL | 0 refills | Status: DC
  Filled 2023-02-16: qty 74, 30d supply, fill #0

## 2023-02-16 NOTE — TOC Transition Note (Signed)
Transition of Care [facility]) - CM/SW Discharge Note   Patient Details  Name: Rachel Mccarty MRN: [id number] Date of Birth: [birth year]  Transition of Care [facility]) CM/SW Contact:  Lockie Pares, RN Phone Number: 02/16/2023, 10:15 AM   Clinical Narrative:     Patient discharging today. Eliquis eligibility done. Medication sent to [facility] pharmacy.  No needs identified  Final next level of care: Home/Self Care Barriers to Discharge: No Barriers Identified   Patient Goals and CMS Choice      Discharge Placement    Home self care                     Discharge Plan and Services Additional resources added to the After Visit Summary for                                       Social Determinants of Health (SDOH) Interventions SDOH Screenings   Food Insecurity: No Food Insecurity (02/15/2023)  Housing: Low Risk  (02/15/2023)  Transportation Needs: No Transportation Needs (02/15/2023)  Utilities: Not At Risk (02/15/2023)  Financial Resource Strain: Low Risk  (07/06/2020)   Received from [facility] visits prior to 06/18/2022., [facility] visits prior to 06/18/2022.  Physical Activity: Insufficiently Active (07/06/2020)   Received from [facility] visits prior to 06/18/2022., [facility] visits prior to 06/18/2022.  Social Connections: Socially Isolated (07/06/2020)   Received from [facility] visits prior to 06/18/2022., [facility] visits prior to 06/18/2022.  Stress: No Stress Concern Present (07/06/2020)   Received from [facility] visits prior to 06/18/2022., [facility] visits prior to 06/18/2022.  Tobacco Use: Low Risk  (02/15/2023)     Readmission Risk Interventions     No data to display

## 2023-02-16 NOTE — Discharge Summary (Signed)
Physician Discharge Summary  Rachel Mccarty:[id number] DOB: July 31, 1960 DOA: 02/15/2023  PCP: Richmond Campbell., PA-C  Admit date: 02/15/2023 Discharge date: 02/16/2023  Time spent: 40 minutes  Recommendations for Outpatient Follow-up:  Follow outpatient CBC/CMP  Follow with PCP/hematology outpatient to discuss risks/benefits of indefinite anticoagulation given this episode apparently unprovoked and her second VTE Needs to be up to date with routine cancer screening Follow pancytopenia with hematology outpatient Follow pulmonary nodule with repeat imaging outpatient Follow aortic dilatation  Follow splenic artery aneurysm outpatient   Discharge Diagnoses:  Principal Problem:   DOE (dyspnea on exertion) Active Problems:   History of pulmonary embolism   Elevated troponin   NSVT (nonsustained ventricular tachycardia) (HCC)   Moderate mitral regurgitation   Pancytopenia (HCC)   Essential hypertension   GERD (gastroesophageal reflux disease)   Discharge Condition: stable  Diet recommendation: heart healthy  Filed Weights   02/15/23 0837  Weight: 75.3 kg    History of present illness:    Rachel Mccarty is Rachel Mccarty 62 y.o. female with medical history significant of hypertension, hyperlipidemia, NSVT, mitral regurgitation, remote history of DVT, and GERD who presents with complaints of shortness of breath.   She was diagnosed with Rachel Mccarty pulmonary embolism.  Stable for discharge on 10/31 on eliquis.   See below for additional [facility] Course:  Assessment and Plan:  Dyspnea on exertion  Acute Pulmonary Embolism  Acute.  Patient presents with 6-day history of shortness of breath with exertion.  Chest x-ray showed no acute abnormality.  Patient noted to have significant elevated D-dimer of 2.75.  Has remote history of DVT thought secondary to OCPs.   CT PE protocol with moderate burden of acute pulmonary emboli bilaterally without evidence of R heart strain Echo with EF  55-60%, no RWMA, RVSF normal LE [place] without DVT No clearly identifiable provoking cause.   Given recurrent DVT, apparently unprovoked, needs at least 3 months anticoagulation and likely reasonable to continue indefinite anticoagulation.  Recommended continued discussion with outpatient PCP, will also refer to hematology.    Elevated troponin Likely due to PE above, no RWMA seen on echo    History of NSVT Patient does report intermittently having palpitations.  Prior history of NSVT for which it appears she had been on Cardizem at some point in time. -Continue atenolol -Follow-up telemetry   Mitral regurgitation Noted to be mild on last echocardiogram from 09/2022 -Follow-up repeat echocardiogram b-> trivial regurg   Pancytopenia Acute on chronic.  WBC 3.7 and platelet count 113.  White blood cell count and platelet count slightly lower than prior checks.  May warrant further workup in outpatient setting -Continue to monitor - heme referral    Essential hypertension Blood pressures elevated up to 152/106. -Continue atenolol   GERD -Continue pharmacy substitution of Protonix  Pulmonary Nodule  7 mm nodule in R lung apex Repeat imaging per radiology recs  Splenic Artery Aneurysm Needs outpatient follow up     Procedures: Echo IMPRESSIONS     1. Left ventricular ejection fraction, by estimation, is 55 to 60%. Left  ventricular ejection fraction by 2D MOD biplane is 58.1 %. The left  ventricle has normal function. The left ventricle has no regional wall  motion abnormalities. Left ventricular  diastolic parameters are consistent with Grade I diastolic dysfunction  (impaired relaxation).   2. Right ventricular systolic function is normal. The right ventricular  size is normal. Tricuspid regurgitation signal is inadequate for assessing  PA pressure.  3. The mitral valve is grossly normal. Trivial mitral valve  regurgitation. No evidence of mitral stenosis.   4. The  aortic valve is tricuspid. Aortic valve regurgitation is mild. No  aortic stenosis is present.   5. There is mild dilatation of the ascending aorta, measuring 40 mm.   6. The inferior vena cava is normal in size with greater than 50%  respiratory variability, suggesting right atrial pressure of 3 mmHg.   LE [place] Summary:  BILATERAL:  - No evidence of deep vein thrombosis seen in the lower extremities,  bilaterally.  -No evidence of popliteal cyst, bilaterally.      Consultations: none  Discharge Exam: Vitals:   02/16/23 0408 02/16/23 0807  BP: (!) 140/89 (!) 141/91  Pulse: 60 68  Resp: 19 18  Temp: 98.2 F (36.8 C) 98.8 F (37.1 C)  SpO2: 98% 98%   No complaints Eager to discharge home  General: No acute distress. Cardiovascular: Heart sounds show Adaline Trejos regular rate, and rhythm. Lungs: Clear to auscultation bilaterally Neurological: Alert and oriented 3. Moves all extremities 4 with equal strength. Cranial nerves II through XII grossly intact. Extremities: No clubbing or cyanosis. No edema.   Discharge Instructions   Discharge Instructions     Ambulatory referral to Hematology / Oncology   Complete by: As directed    Call MD for:  difficulty breathing, headache or visual disturbances   Complete by: As directed    Call MD for:  extreme fatigue   Complete by: As directed    Call MD for:  hives   Complete by: As directed    Call MD for:  persistant dizziness or light-headedness   Complete by: As directed    Call MD for:  persistant nausea and vomiting   Complete by: As directed    Call MD for:  redness, tenderness, or signs of infection (pain, swelling, redness, odor or green/yellow discharge around incision site)   Complete by: As directed    Call MD for:  severe uncontrolled pain   Complete by: As directed    Call MD for:  temperature >100.4   Complete by: As directed    Diet - low sodium heart healthy   Complete by: As directed    Discharge instructions    Complete by: As directed    You were seen for blood clots in your lungs (pulmonary embolism).  This was seemingly "unprovoked" (there was no clearly identified risk factor for your blood clot other than your prolonged sitting at work).    We'll discharge you with eliquis to continue uninterrupted for at least 3 months.  After this, indefinite anticoagulation is reasonable given this is your 2nd blood clot and it was unprovoked.  Please discuss this additionally with your outpatient provider.  I'll also send you to hematology.  You had Jenika Chiem pulmonary nodule seen on imaging, follow this with repeat imaging within 6 months with your PCP.  You had Joelee Snoke splenic artery aneurysm seen on imaging.  Follow this with your PCP outpatient.  Hematology can also follow up on your chronically low platelets.  Return for new, recurrent, or worsening symptoms.  Please ask your PCP to request records from this hospitalization so they know what was done and what the next steps will be.   Increase activity slowly   Complete by: As directed       Allergies as of 02/16/2023   No Known Allergies      Medication List  STOP taking these medications    cephALEXin 500 MG capsule Commonly known as: KEFLEX       TAKE these medications    ALPRAZolam 0.25 MG tablet Commonly known as: XANAX Take 0.25 mg by mouth as needed for anxiety.   Apixaban Starter Pack (10mg  and 5mg ) Commonly known as: ELIQUIS STARTER PACK Take as directed on package: start with two-5mg  tablets twice daily for 7 days. On day 8, switch to one-5mg  tablet twice daily.   apixaban 5 MG Tabs tablet Commonly known as: ELIQUIS Take 1 tablet (5 mg total) by mouth 2 (two) times daily. Save this prescription to fill later.  Take this once you've completed your eliquis starter pack. Start taking on: March 16, 2023   atenolol 50 MG tablet Commonly known as: TENORMIN TAKE 1 TABLET BY MOUTH EVERY DAY   fluticasone 50 MCG/ACT nasal  spray Commonly known as: FLONASE Place 2 sprays into both nostrils daily as needed for allergies.   Multiple Vitamin tablet Take 2 tablets by mouth at bedtime.   omeprazole 40 MG capsule Commonly known as: PRILOSEC Take 40 mg by mouth daily.       No Known Allergies    The results of significant diagnostics from this hospitalization (including imaging, microbiology, ancillary and laboratory) are listed below for reference.    Significant Diagnostic Studies: VAS [place] LOWER EXTREMITY VENOUS (DVT)  Result Date: 02/15/2023  Lower Venous DVT Study Patient Name:  ZUJEY STAIR  Date of Exam:   02/15/2023 Medical Rec #: [id number]        Accession #:    [id number] Date of Birth: 1961/04/02         Patient Gender: F Patient Age:   13 years Exam Location:  [facility] Procedure:      VAS [place] LOWER EXTREMITY VENOUS (DVT) Referring Phys: Madelyn Flavors --------------------------------------------------------------------------------  Indications: Pulmonary embolism.  Risk Factors: Confirmed PE DVT Over 15 years ago per patient. Comparison Study: No prior study Performing Technologist: Fernande Bras  Examination Guidelines: Eloina Ergle complete evaluation includes B-mode imaging, spectral Doppler, color Doppler, and power Doppler as needed of all accessible portions of each vessel. Bilateral testing is considered an integral part of Blaize Epple complete examination. Limited examinations for reoccurring indications may be performed as noted. The reflux portion of the exam is performed with the patient in reverse Trendelenburg.  +---------+---------------+---------+-----------+----------+--------------+ RIGHT    CompressibilityPhasicitySpontaneityPropertiesThrombus Aging +---------+---------------+---------+-----------+----------+--------------+ CFV      Full           Yes      Yes                                 +---------+---------------+---------+-----------+----------+--------------+ SFJ      Full                                                         +---------+---------------+---------+-----------+----------+--------------+ FV Prox  Full                                                        +---------+---------------+---------+-----------+----------+--------------+ FV Mid  Full                                                        +---------+---------------+---------+-----------+----------+--------------+ FV DistalFull                                                        +---------+---------------+---------+-----------+----------+--------------+ PFV      Full                                                        +---------+---------------+---------+-----------+----------+--------------+ POP      Full           Yes      Yes                                 +---------+---------------+---------+-----------+----------+--------------+ PTV      Full                                                        +---------+---------------+---------+-----------+----------+--------------+ PERO     Full                                                        +---------+---------------+---------+-----------+----------+--------------+   +---------+---------------+---------+-----------+----------+--------------+ LEFT     CompressibilityPhasicitySpontaneityPropertiesThrombus Aging +---------+---------------+---------+-----------+----------+--------------+ CFV      Full           Yes      Yes                                 +---------+---------------+---------+-----------+----------+--------------+ SFJ      Full                                                        +---------+---------------+---------+-----------+----------+--------------+ FV Prox  Full                                                        +---------+---------------+---------+-----------+----------+--------------+ FV Mid   Full                                                         +---------+---------------+---------+-----------+----------+--------------+  FV DistalFull                                                        +---------+---------------+---------+-----------+----------+--------------+ PFV      Full                                                        +---------+---------------+---------+-----------+----------+--------------+ POP      Full           Yes      Yes                                 +---------+---------------+---------+-----------+----------+--------------+ PTV      Full                                                        +---------+---------------+---------+-----------+----------+--------------+ PERO     Full                                                        +---------+---------------+---------+-----------+----------+--------------+     Summary: BILATERAL: - No evidence of deep vein thrombosis seen in the lower extremities, bilaterally. -No evidence of popliteal cyst, bilaterally.   *See table(s) above for measurements and observations.    Preliminary    ECHOCARDIOGRAM COMPLETE  Result Date: 02/15/2023    ECHOCARDIOGRAM REPORT   Patient Name:   RHIANNON BRIEN Date of Exam: 02/15/2023 Medical Rec #:  [id number]       Height:       65.0 in Accession #:    [id number]      Weight:       166.1 lb Date of Birth:  13-Aug-1960        BSA:          1.828 m Patient Age:    62 years        BP:           121/85 mmHg Patient Gender: F               HR:           75 bpm. Exam Location:  Inpatient Procedure: 2D Echo, Color Doppler and Cardiac Doppler Indications:    Dyspnea  History:        Patient has prior history of Echocardiogram examinations, most                 recent 09/20/2022. Signs/Symptoms:Dyspnea; Risk                 Factors:Hypertension and Dyslipidemia.  Sonographer:    Milda Smart Referring Phys: Clydie Braun  Sonographer Comments: Image acquisition challenging due to patient body habitus and Image  acquisition challenging due to respiratory motion. IMPRESSIONS  1. Left ventricular ejection  fraction, by estimation, is 55 to 60%. Left ventricular ejection fraction by 2D MOD biplane is 58.1 %. The left ventricle has normal function. The left ventricle has no regional wall motion abnormalities. Left ventricular diastolic parameters are consistent with Grade I diastolic dysfunction (impaired relaxation).  2. Right ventricular systolic function is normal. The right ventricular size is normal. Tricuspid regurgitation signal is inadequate for assessing PA pressure.  3. The mitral valve is grossly normal. Trivial mitral valve regurgitation. No evidence of mitral stenosis.  4. The aortic valve is tricuspid. Aortic valve regurgitation is mild. No aortic stenosis is present.  5. There is mild dilatation of the ascending aorta, measuring 40 mm.  6. The inferior vena cava is normal in size with greater than 50% respiratory variability, suggesting right atrial pressure of 3 mmHg. FINDINGS  Left Ventricle: Left ventricular ejection fraction, by estimation, is 55 to 60%. Left ventricular ejection fraction by 2D MOD biplane is 58.1 %. The left ventricle has normal function. The left ventricle has no regional wall motion abnormalities. The left ventricular internal cavity size was normal in size. There is no left ventricular hypertrophy. Left ventricular diastolic parameters are consistent with Grade I diastolic dysfunction (impaired relaxation). Right Ventricle: The right ventricular size is normal. No increase in right ventricular wall thickness. Right ventricular systolic function is normal. Tricuspid regurgitation signal is inadequate for assessing PA pressure. Left Atrium: Left atrial size was normal in size. Right Atrium: Right atrial size was normal in size. Pericardium: There is no evidence of pericardial effusion. Mitral Valve: The mitral valve is grossly normal. Trivial mitral valve regurgitation. No evidence of mitral  valve stenosis. Tricuspid Valve: The tricuspid valve is grossly normal. Tricuspid valve regurgitation is trivial. No evidence of tricuspid stenosis. Aortic Valve: The aortic valve is tricuspid. Aortic valve regurgitation is mild. Aortic regurgitation PHT measures 460 msec. No aortic stenosis is present. Pulmonic Valve: The pulmonic valve was grossly normal. Pulmonic valve regurgitation is not visualized. No evidence of pulmonic stenosis. Aorta: The aortic root is normal in size and structure. There is mild dilatation of the ascending aorta, measuring 40 mm. Venous: The inferior vena cava is normal in size with greater than 50% respiratory variability, suggesting right atrial pressure of 3 mmHg. IAS/Shunts: The atrial septum is grossly normal.  LEFT VENTRICLE PLAX 2D                        Biplane EF (MOD) LVIDd:         4.50 cm         LV Biplane EF:   Left LVIDs:         3.10 cm                          ventricular LV PW:         0.90 cm                          ejection LV IVS:        0.90 cm                          fraction by LVOT diam:     2.10 cm                          2D MOD LV SV:  75                               biplane is LV SV Index:   41                               58.1 %. LVOT Area:     3.46 cm                                Diastology                                LV e' medial:    7.07 cm/s LV Volumes (MOD)               LV E/e' medial:  10.1 LV vol d, MOD    60.4 ml       LV e' lateral:   7.40 cm/s A2C:                           LV E/e' lateral: 9.7 LV vol d, MOD    79.6 ml A4C: LV vol s, MOD    27.9 ml A2C: LV vol s, MOD    30.8 ml A4C: LV SV MOD A2C:   32.5 ml LV SV MOD A4C:   79.6 ml LV SV MOD BP:    40.8 ml RIGHT VENTRICLE             IVC RV Basal diam:  3.20 cm     IVC diam: 1.70 cm RV S prime:     10.10 cm/s TAPSE (M-mode): 2.4 cm LEFT ATRIUM             Index        RIGHT ATRIUM           Index LA diam:        2.50 cm 1.37 cm/m   RA Area:     11.90 cm LA Vol (A2C):   41.4 ml  22.68 ml/m  RA Volume:   23.90 ml  13.07 ml/m LA Vol (A4C):   31.7 ml 17.34 ml/m LA Biplane Vol: 36.3 ml 19.86 ml/m  AORTIC VALVE LVOT Vmax:   104.00 cm/s LVOT Vmean:  76.500 cm/s LVOT VTI:    0.216 m AI PHT:      460 msec  AORTA Ao Root diam: 3.80 cm Ao Asc diam:  4.00 cm MITRAL VALVE MV Area (PHT): 2.75 cm    SHUNTS MV Decel Time: 276 msec    Systemic VTI:  0.22 m MR Peak grad: 49.6 mmHg    Systemic Diam: 2.10 cm MR Vmax:      352.00 cm/s MV E velocity: 71.60 cm/s MV Byron Tipping velocity: 94.30 cm/s MV E/Zeenat Jeanbaptiste ratio:  0.76 Lennie Odor MD Electronically signed by Lennie Odor MD Signature Date/Time: 02/15/2023/3:36:32 PM    Final    CT Angio Chest Pulmonary Embolism (PE) W or WO Contrast  Result Date: 02/15/2023 CLINICAL DATA:  Shortness of breath EXAM: CT ANGIOGRAPHY CHEST WITH CONTRAST TECHNIQUE: Multidetector CT imaging of the chest was performed using the standard protocol during bolus administration of intravenous contrast. Multiplanar CT image reconstructions and MIPs were obtained to evaluate the vascular anatomy. RADIATION DOSE REDUCTION:  This exam was performed according to the departmental dose-optimization program which includes automated exposure control, adjustment of the mA and/or kV according to patient size and/or use of iterative reconstruction technique. CONTRAST:  75mL OMNIPAQUE IOHEXOL 350 MG/ML SOLN COMPARISON:  Same day chest radiograph, cardiac CT 12/15/2020 FINDINGS: Cardiovascular: There is adequate opacification of the pulmonary arteries to the segmental level. There is Xzavian Semmel moderate burden of pulmonary emboli extending from the right main pulmonary artery to the segmental and subsegmental branches as well as in the left upper and lower lobe segmental and subsegmental pulmonary arteries. There is no evidence of right heart strain the heart size is normal. There is no pericardial effusion. There is mild calcified plaque in the thoracic aorta. Mediastinum/Nodes: The imaged thyroid is  unremarkable. The esophagus is grossly unremarkable. There is no mediastinal, hilar, or axillary lymphadenopathy. Lungs/Pleura: The trachea and central airways are patent. The lungs are well inflated. There is minimal dependent subsegmental atelectasis bilaterally. There is no focal consolidation or pulmonary edema. There is no evidence of pulmonary infarction. Mosaic attenuation in the lungs may reflect air trapping/small airway disease. There is no pleural effusion or pneumothorax. There is Marylynn Rigdon 7 mm nodule in the right lung apex (7-28). Upper Abdomen: There is Aemon Koeller peripherally calcified lobulated left splenic artery aneurysm measuring 1.3 cm x 1.1 cm. The imaged upper abdominal viscera are otherwise unremarkable. Musculoskeletal: There is no acute osseous abnormality or suspicious osseous lesion. Review of the MIP images confirms the above findings. IMPRESSION: 1. Moderate burden of acute pulmonary emboli bilaterally without evidence of right heart strain. 2. 7 mm nodule in the right lung apex. Non-contrast chest CT at 6-12 months is recommended. If the nodule is stable at time of repeat CT, then future CT at 18-24 months (from today's scan) is considered optional for low-risk patients, but is recommended for high-risk patients. 3. Mosaic attenuation in the lungs suggesting air trapping/small airway disease. 4. 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 Impression pont #1 was called by telephone at the time of interpretation on 02/15/2023 at 12:58 pm to provider [facility] , who verbally acknowledged these results. Electronically Signed   By: Lesia Hausen M.D.   On: 02/15/2023 12:58   DG Chest Port 1 View  Result Date: 02/15/2023 CLINICAL DATA:  62 year old female with history of epigastric pain. EXAM: PORTABLE CHEST 1 VIEW COMPARISON:  Chest x-ray 09/19/2018. FINDINGS: Lung volumes are normal. No consolidative airspace disease. No pleural effusions. No pneumothorax. No pulmonary nodule or mass  noted. Pulmonary vasculature and the cardiomediastinal silhouette are within normal limits. Atherosclerosis in the thoracic aorta. IMPRESSION: 1.  No radiographic evidence of acute cardiopulmonary disease. 2. Aortic atherosclerosis. Electronically Signed   By: Trudie Reed M.D.   On: 02/15/2023 06:04    Microbiology: No results found for this or any previous visit (from the past 240 hour(s)).   Labs: Basic Metabolic Panel: Recent Labs  Lab 02/15/23 0525 02/15/23 1038 02/16/23 0527  NA 137  --  137  K 4.0  --  3.9  CL 104  --  106  CO2 26  --  22  GLUCOSE 102*  --  85  BUN 13  --  10  CREATININE 1.00  --  1.01*  CALCIUM 9.7  --  9.1  MG  --  2.1  --   PHOS  --  2.9  --    Liver Function Tests: Recent Labs  Lab 02/15/23 0525  AST 17  ALT 15  ALKPHOS  50  BILITOT 0.7  PROT 6.6  ALBUMIN 4.4   Recent Labs  Lab 02/15/23 0525  LIPASE 26   No results for input(s): "AMMONIA" in the last 168 hours. CBC: Recent Labs  Lab 02/15/23 0525 02/16/23 0527  WBC 3.7* 3.5*  HGB 13.0 13.5  HCT 37.2 39.7  MCV 90.7 90.6  PLT 113* 107*   Cardiac Enzymes: No results for input(s): "CKTOTAL", "CKMB", "CKMBINDEX", "TROPONINI" in the last 168 hours. BNP: BNP (last 3 results) Recent Labs    02/15/23 0525  BNP 41.6    ProBNP (last 3 results) No results for input(s): "PROBNP" in the last 8760 hours.  CBG: No results for input(s): "GLUCAP" in the last 168 hours.     Signed:  Lacretia Nicks MD.  [facility] 02/16/2023, 10:09 AM

## 2023-02-16 NOTE — Progress Notes (Signed)
Patient ambulated in hallway approximately 750 ft. Oxygen saturation on room air remained above 96%. Patient oxygen saturation at rest on room air 99%.  Lidia Collum, RN

## 2023-02-16 NOTE — Plan of Care (Signed)
Patient ID: Rachel Mccarty, female   DOB: 09-01-1960, 62 y.o.   MRN: [id number]  Problem: Education: Goal: Knowledge of General Education information will improve Description: Including pain rating scale, medication(s)/side effects and non-pharmacologic comfort measures Outcome: Adequate for Discharge   Problem: Health Behavior/Discharge Planning: Goal: Ability to manage health-related needs will improve Outcome: Adequate for Discharge   Problem: Clinical Measurements: Goal: Ability to maintain clinical measurements within normal limits will improve Outcome: Adequate for Discharge Goal: Will remain free from infection Outcome: Adequate for Discharge Goal: Diagnostic test results will improve Outcome: Adequate for Discharge Goal: Respiratory complications will improve Outcome: Adequate for Discharge Goal: Cardiovascular complication will be avoided Outcome: Adequate for Discharge   Problem: Activity: Goal: Risk for activity intolerance will decrease Outcome: Adequate for Discharge   Problem: Nutrition: Goal: Adequate nutrition will be maintained Outcome: Adequate for Discharge   Problem: Coping: Goal: Level of anxiety will decrease Outcome: Adequate for Discharge   Problem: Elimination: Goal: Will not experience complications related to bowel motility Outcome: Adequate for Discharge Goal: Will not experience complications related to urinary retention Outcome: Adequate for Discharge   Problem: Pain Management: Goal: General experience of comfort will improve Outcome: Adequate for Discharge   Problem: Safety: Goal: Ability to remain free from injury will improve Outcome: Adequate for Discharge   Problem: Skin Integrity: Goal: Risk for impaired skin integrity will decrease Outcome: Adequate for Discharge    Lidia Collum, RN

## 2023-02-23 ENCOUNTER — Encounter: Payer: Self-pay | Admitting: Family Medicine

## 2023-02-23 ENCOUNTER — Other Ambulatory Visit: Payer: Self-pay | Admitting: Family Medicine

## 2023-02-23 DIAGNOSIS — R911 Solitary pulmonary nodule: Secondary | ICD-10-CM

## 2023-02-24 ENCOUNTER — Encounter: Payer: Self-pay | Admitting: Cardiology

## 2023-02-27 ENCOUNTER — Ambulatory Visit: Payer: 59 | Attending: Cardiology | Admitting: Cardiology

## 2023-02-27 ENCOUNTER — Encounter: Payer: Self-pay | Admitting: Cardiology

## 2023-02-27 VITALS — BP 128/88 | HR 65 | Resp 16 | Ht 65.0 in | Wt 164.4 lb

## 2023-02-27 DIAGNOSIS — I4729 Other ventricular tachycardia: Secondary | ICD-10-CM

## 2023-02-27 DIAGNOSIS — E782 Mixed hyperlipidemia: Secondary | ICD-10-CM | POA: Diagnosis not present

## 2023-02-27 DIAGNOSIS — I10 Essential (primary) hypertension: Secondary | ICD-10-CM

## 2023-02-27 DIAGNOSIS — I340 Nonrheumatic mitral (valve) insufficiency: Secondary | ICD-10-CM

## 2023-02-27 DIAGNOSIS — I728 Aneurysm of other specified arteries: Secondary | ICD-10-CM | POA: Diagnosis not present

## 2023-02-27 DIAGNOSIS — I351 Nonrheumatic aortic (valve) insufficiency: Secondary | ICD-10-CM

## 2023-02-27 NOTE — Progress Notes (Signed)
Cardiology Office Note:   Date:  02/27/2023  ID:  MADIHA CISSEL, DOB [birth year], MRN [id number] PCP: Richmond Campbell., PA-C  [facility] Providers Cardiologist:  Elder Negus, MD    History of Present Illness:   Discussed the use of AI scribe software for clinical note transcription with the patient, who gave verbal consent to proceed.  History of Present Illness   The patient, a 62 year old individual with a history of hypertension, hyperlipidemia, mild mitral regurgitation, trace aortic stenosis and aortic insufficiency, and palpitations/NSVT, was recently hospitalized due to shortness of breath and diagnosed with a pulmonary embolism. She was discharged on Eliquis. 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  The patient reports feeling well at present, but has experienced some chest discomfort described as burning, which she attributes to the recent pulmonary embolism and Eliquis. She also reports gastrointestinal side effects, including bloating and loss of appetite, which she believes may be related to the Eliquis. She has a history of stomach issues and is currently taking omeprazole for acid reflux.  The patient has also experienced palpitations, which she reports as being generally well-controlled. Her blood pressure has been elevated in the past, but was within normal limits at the time of the consultation. The patient's LDL was noted to be elevated at 135 in a lipid panel conducted in April 2024. Calcium score 0 on CT coronary calcium scoring study conducted a few years ago and denies any significant family history of coronary artery disease. She has expressed a preference to manage her cholesterol through diet and lifestyle modifications rather than medication.     Today patient denies chest pain, shortness of breath,  lower extremity edema, fatigue, melena, hematuria, hemoptysis, diaphoresis, weakness, presyncope, syncope, orthopnea, and PND.   Studies Reviewed:    EKG today with NSR. No acute ischemic changes.   Cardiac Studies & Procedures       ECHOCARDIOGRAM  ECHOCARDIOGRAM COMPLETE 02/15/2023  Narrative ECHOCARDIOGRAM REPORT    Patient Name:   VENITTA BACKES Date of Exam: 02/15/2023 Medical Rec #:  [id number]       Height:       65.0 in Accession #:    [id number]      Weight:       166.1 lb Date of Birth:  1960-11-28        BSA:          1.828 m Patient Age:    62 years        BP:           121/85 mmHg Patient Gender: F               HR:           75 bpm. Exam Location:  Inpatient  Procedure: 2D Echo, Color Doppler and Cardiac Doppler  Indications:    Dyspnea  History:        Patient has prior history of Echocardiogram examinations, most recent 09/20/2022. Signs/Symptoms:Dyspnea; Risk Factors:Hypertension and Dyslipidemia.  Sonographer:    Milda Smart Referring Phys: Clydie Braun   Sonographer Comments: Image acquisition challenging due to patient body habitus and Image acquisition challenging due to respiratory motion. IMPRESSIONS   1. Left ventricular ejection fraction, by estimation, is 55 to 60%. Left ventricular ejection fraction by 2D MOD biplane is 58.1 %. The left ventricle has normal function. The  left ventricle has no regional wall motion abnormalities. Left ventricular diastolic parameters are consistent with Grade I diastolic dysfunction (impaired relaxation). 2. Right ventricular systolic function is normal. The right ventricular size is normal. Tricuspid regurgitation signal is inadequate for assessing PA pressure. 3. The mitral valve is grossly normal. Trivial mitral valve regurgitation. No evidence of mitral stenosis. 4. The aortic valve is tricuspid. Aortic valve regurgitation is mild. No aortic stenosis is present. 5. There is mild dilatation of the  ascending aorta, measuring 40 mm. 6. The inferior vena cava is normal in size with greater than 50% respiratory variability, suggesting right atrial pressure of 3 mmHg.  FINDINGS Left Ventricle: Left ventricular ejection fraction, by estimation, is 55 to 60%. Left ventricular ejection fraction by 2D MOD biplane is 58.1 %. The left ventricle has normal function. The left ventricle has no regional wall motion abnormalities. The left ventricular internal cavity size was normal in size. There is no left ventricular hypertrophy. Left ventricular diastolic parameters are consistent with Grade I diastolic dysfunction (impaired relaxation).  Right Ventricle: The right ventricular size is normal. No increase in right ventricular wall thickness. Right ventricular systolic function is normal. Tricuspid regurgitation signal is inadequate for assessing PA pressure.  Left Atrium: Left atrial size was normal in size.  Right Atrium: Right atrial size was normal in size.  Pericardium: There is no evidence of pericardial effusion.  Mitral Valve: The mitral valve is grossly normal. Trivial mitral valve regurgitation. No evidence of mitral valve stenosis.  Tricuspid Valve: The tricuspid valve is grossly normal. Tricuspid valve regurgitation is trivial. No evidence of tricuspid stenosis.  Aortic Valve: The aortic valve is tricuspid. Aortic valve regurgitation is mild. Aortic regurgitation PHT measures 460 msec. No aortic stenosis is present.  Pulmonic Valve: The pulmonic valve was grossly normal. Pulmonic valve regurgitation is not visualized. No evidence of pulmonic stenosis.  Aorta: The aortic root is normal in size and structure. There is mild dilatation of the ascending aorta, measuring 40 mm.  Venous: The inferior vena cava is normal in size with greater than 50% respiratory variability, suggesting right atrial pressure of 3 mmHg.  IAS/Shunts: The atrial septum is grossly normal.   LEFT VENTRICLE PLAX  2D                        Biplane EF (MOD) LVIDd:         4.50 cm         LV Biplane EF:   Left LVIDs:         3.10 cm                          ventricular LV PW:         0.90 cm                          ejection LV IVS:        0.90 cm                          fraction by LVOT diam:     2.10 cm                          2D MOD LV SV:         75  biplane is LV SV Index:   41                               58.1 %. LVOT Area:     3.46 cm Diastology LV e' medial:    7.07 cm/s LV Volumes (MOD)               LV E/e' medial:  10.1 LV vol d, MOD    60.4 ml       LV e' lateral:   7.40 cm/s A2C:                           LV E/e' lateral: 9.7 LV vol d, MOD    79.6 ml A4C: LV vol s, MOD    27.9 ml A2C: LV vol s, MOD    30.8 ml A4C: LV SV MOD A2C:   32.5 ml LV SV MOD A4C:   79.6 ml LV SV MOD BP:    40.8 ml  RIGHT VENTRICLE             IVC RV Basal diam:  3.20 cm     IVC diam: 1.70 cm RV S prime:     10.10 cm/s TAPSE (M-mode): 2.4 cm  LEFT ATRIUM             Index        RIGHT ATRIUM           Index LA diam:        2.50 cm 1.37 cm/m   RA Area:     11.90 cm LA Vol (A2C):   41.4 ml 22.68 ml/m  RA Volume:   23.90 ml  13.07 ml/m LA Vol (A4C):   31.7 ml 17.34 ml/m LA Biplane Vol: 36.3 ml 19.86 ml/m AORTIC VALVE LVOT Vmax:   104.00 cm/s LVOT Vmean:  76.500 cm/s LVOT VTI:    0.216 m AI PHT:      460 msec  AORTA Ao Root diam: 3.80 cm Ao Asc diam:  4.00 cm  MITRAL VALVE MV Area (PHT): 2.75 cm    SHUNTS MV Decel Time: 276 msec    Systemic VTI:  0.22 m MR Peak grad: 49.6 mmHg    Systemic Diam: 2.10 cm MR Vmax:      352.00 cm/s MV E velocity: 71.60 cm/s MV A velocity: 94.30 cm/s MV E/A ratio:  0.76  Lennie Odor MD Electronically signed by Lennie Odor MD Signature Date/Time: 02/15/2023/3:36:32 PM    Final              Risk Assessment/Calculations:              Physical Exam:   VS:  BP 128/88 (BP Location: Left Arm, Patient Position:  Sitting, Cuff Size: Normal)   Pulse 65   Resp 16   Ht 5\' 5"  (1.651 m)   Wt 164 lb 6.4 oz (74.6 kg)   SpO2 96%   BMI 27.36 kg/m    Wt Readings from Last 3 Encounters:  02/27/23 164 lb 6.4 oz (74.6 kg)  02/15/23 166 lb 1.6 oz (75.3 kg)  09/26/22 168 lb (76.2 kg)     Physical Exam Vitals reviewed.  Constitutional:      Appearance: Normal appearance.  HENT:     Head: Normocephalic.     Nose: Nose normal.  Eyes:     Pupils: Pupils are equal, round,  and reactive to light.  Cardiovascular:     Rate and Rhythm: Normal rate and regular rhythm.     Pulses: Normal pulses.     Heart sounds: Normal heart sounds. No murmur heard.    No friction rub. No gallop.  Pulmonary:     Effort: Pulmonary effort is normal.     Breath sounds: Normal breath sounds.  Abdominal:     General: Abdomen is flat.  Musculoskeletal:     Right lower leg: No edema.     Left lower leg: No edema.  Skin:    General: Skin is warm and dry.     Capillary Refill: Capillary refill takes less than 2 seconds.  Neurological:     General: No focal deficit present.     Mental Status: She is alert and oriented to person, place, and time.  Psychiatric:        Mood and Affect: Mood normal.        Behavior: Behavior normal.        Thought Content: Thought content normal.        Judgment: Judgment normal.       ASSESSMENT AND PLAN:     Assessment and Plan    Pulmonary Embolism Recently discharged on Eliquis following hospitalization for pulmonary embolism. Reports GI side effects, possibly related to Eliquis. -Continue Eliquis as indicated for recent pulmonary embolism. -Consider following up with PCP regarding GI symptoms.  Borderline Ascending Aortic Dilation Recent echocardiogram showed borderline dilation of ascending aorta (40mm). Discussed that this is a borderline finding, can be monitored annually. -Repeat echocardiogram in 1 year to monitor aortic dilation.  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 Low risk at current size. -Order annual mesenteric duplex to monitor aneurysm size. If changes noted, patient would need vascular surgery referral for further management.   Hyperlipidemia Elevated LDL (135) on most recent lipid panel. Patient prefers to manage with diet and lifestyle modifications. -Encourage adherence to Mediterranean diet and regular exercise. -Repeat lipid panel as part of routine monitoring.  Hypertension Blood pressure well-controlled at current visit. Patient currently on Atenolol 50mg . -Continue Atenolol 50mg . -Advise patient to monitor blood pressure at home and report readings consistently over 130.  Non-sustained Ventricular Tachycardia Reports occasional palpitations, but generally well-controlled. -Continue Atenolol 50mg  for rate control.             Signed, Perlie Gold, PA-C

## 2023-02-27 NOTE — Patient Instructions (Addendum)
Medication Instructions:  Your physician recommends that you continue on your current medications as directed. Please refer to the Current Medication list given to you today.  *If you need a refill on your cardiac medications before your next appointment, please call your pharmacy*  Lab Work: None ordered today. If you have labs (blood work) drawn today and your tests are completely normal, you will receive your results only by: MyChart Message (if you have MyChart) OR A paper copy in the mail If you have any lab test that is abnormal or we need to change your treatment, we will call you to review the results.  Testing/Procedures: Your physician has requested that you have an echocardiogram in 1 year. Echocardiography is a painless test that uses sound waves to create images of your heart. It provides your doctor with information about the size and shape of your heart and how well your heart's chambers and valves are working. This procedure takes approximately one hour. There are no restrictions for this procedure. Please do NOT wear cologne, perfume, aftershave, or lotions (deodorant is allowed). Please arrive 15 minutes prior to your appointment time.  Please note: We ask at that you not bring children with you during ultrasound (echo/ vascular) testing. Due to room size and safety concerns, children are not allowed in the ultrasound rooms during exams. Our front office staff cannot provide observation of children in our lobby area while testing is being conducted. An adult accompanying a patient to their appointment will only be allowed in the ultrasound room at the discretion of the ultrasound technician under special circumstances. We apologize for any inconvenience.  Your physician has ordered a Mesenteric Duplex for the splenic artery in 1 year.   Follow-Up: At [facility], you and your health needs are our priority.  As part of our continuing mission to provide you with exceptional  heart care, we have created designated Provider Care Teams.  These Care Teams include your primary Cardiologist (physician) and Advanced Practice Providers (APPs -  Physician Assistants and Nurse Practitioners) who all work together to provide you with the care you need, when you need it.   Your next appointment:   1 year(s)  The format for your next appointment:   In Person  Provider:   Perlie Gold, PA-C

## 2023-03-02 ENCOUNTER — Inpatient Hospital Stay: Payer: 59

## 2023-03-02 ENCOUNTER — Inpatient Hospital Stay: Payer: 59 | Attending: Hematology | Admitting: Hematology

## 2023-03-02 VITALS — BP 135/88 | HR 65 | Temp 97.5°F | Resp 20 | Wt 164.4 lb

## 2023-03-02 DIAGNOSIS — Z86718 Personal history of other venous thrombosis and embolism: Secondary | ICD-10-CM | POA: Insufficient documentation

## 2023-03-02 DIAGNOSIS — D61818 Other pancytopenia: Secondary | ICD-10-CM | POA: Diagnosis not present

## 2023-03-02 DIAGNOSIS — I2694 Multiple subsegmental pulmonary emboli without acute cor pulmonale: Secondary | ICD-10-CM

## 2023-03-02 DIAGNOSIS — Z7901 Long term (current) use of anticoagulants: Secondary | ICD-10-CM | POA: Insufficient documentation

## 2023-03-02 DIAGNOSIS — Z86711 Personal history of pulmonary embolism: Secondary | ICD-10-CM | POA: Insufficient documentation

## 2023-03-02 LAB — CBC WITH DIFFERENTIAL (CANCER CENTER ONLY)
Abs Immature Granulocytes: 0.01 10*3/uL (ref 0.00–0.07)
Basophils Absolute: 0 10*3/uL (ref 0.0–0.1)
Basophils Relative: 1 %
Eosinophils Absolute: 0.1 10*3/uL (ref 0.0–0.5)
Eosinophils Relative: 3 %
HCT: 42.7 % (ref 36.0–46.0)
Hemoglobin: 14.5 g/dL (ref 12.0–15.0)
Immature Granulocytes: 0 %
Lymphocytes Relative: 23 %
Lymphs Abs: 0.8 10*3/uL (ref 0.7–4.0)
MCH: 31.4 pg (ref 26.0–34.0)
MCHC: 34 g/dL (ref 30.0–36.0)
MCV: 92.4 fL (ref 80.0–100.0)
Monocytes Absolute: 0.3 10*3/uL (ref 0.1–1.0)
Monocytes Relative: 8 %
Neutro Abs: 2.3 10*3/uL (ref 1.7–7.7)
Neutrophils Relative %: 65 %
Platelet Count: 168 10*3/uL (ref 150–400)
RBC: 4.62 MIL/uL (ref 3.87–5.11)
RDW: 11.9 % (ref 11.5–15.5)
WBC Count: 3.6 10*3/uL — ABNORMAL LOW (ref 4.0–10.5)
nRBC: 0 % (ref 0.0–0.2)

## 2023-03-02 LAB — ANTITHROMBIN III: AntiThromb III Func: 115 % (ref 75–120)

## 2023-03-02 NOTE — Progress Notes (Signed)
HEMATOLOGY/ONCOLOGY CONSULTATION NOTE  Date of Service: 03/02/2023  Patient Care Team: Richmond Campbell., PA-C as PCP - General (Family Medicine) Elder Negus, MD as PCP - Cardiology (Cardiology)  CHIEF COMPLAINTS/PURPOSE OF CONSULTATION:  evaluation and management of history of pulmonary embolism, pancytopenia  HISTORY OF PRESENTING ILLNESS:  Rachel Mccarty is a wonderful 62 y.o. female who has been referred to [place] by Richmond Campbell, PA-C for evaluation and management of history of pulmonary embolism, pancytopenia.   She presented to the ED on 02/15/2023 for shortness of breath. Labs showed WBC 3.7 and platelets 113K. Chest x-ray showed no acute abnormality.   CT angio chest PE showed     -CBC from 02/16/2023 showed WBC of 3.5K, hemoglobin of 13.5, and platelets of 107K. -     MEDICAL HISTORY:  Past Medical History:  Diagnosis Date   Acid reflux    Hyperlipidemia    Hypertension    Palpitations     SURGICAL HISTORY: Past Surgical History:  Procedure Laterality Date   COLONOSCOPY  2013   OVARIAN CYST SURGERY     UPPER GI ENDOSCOPY  2018    SOCIAL HISTORY: Social History   Socioeconomic History   Marital status: Legally Separated    Spouse name: Not on file   Number of children: 2   Years of education: Not on file   Highest education level: Not on file  Occupational History   Not on file  Tobacco Use   Smoking status: Never   Smokeless tobacco: Never  Vaping Use   Vaping status: Never Used  Substance and Sexual Activity   Alcohol use: Yes    Alcohol/week: 1.0 standard drink of alcohol    Types: 1 Cans of beer per week    Comment: 1 drink per day   Drug use: Never   Sexual activity: Not on file  Other Topics Concern   Not on file  Social History Narrative   Not on file   Social Determinants of Health   Financial Resource Strain: Low Risk  (07/06/2020)   Received from [facility] visits prior to 06/18/2022.,  [facility] visits prior to 06/18/2022.   Overall Financial Resource Strain (CARDIA)    Difficulty of Paying Living Expenses: Not hard at all  Food Insecurity: No Food Insecurity (02/15/2023)   Hunger Vital Sign    Worried About Running Out of Food in the Last Year: Never true    Ran Out of Food in the Last Year: Never true  Transportation Needs: No Transportation Needs (02/15/2023)   PRAPARE - Administrator, Civil Service (Medical): No    Lack of Transportation (Non-Medical): No  Physical Activity: Insufficiently Active (07/06/2020)   Received from [facility] visits prior to 06/18/2022., [facility] visits prior to 06/18/2022.   Exercise Vital Sign    Days of Exercise per Week: 3 days    Minutes of Exercise per Session: 20 min  Stress: No Stress Concern Present (07/06/2020)   [facility] visits prior to 06/18/2022., [facility] visits prior to 06/18/2022.   [employer] of Occupational Health - Occupational Stress Questionnaire    Feeling of Stress : Only a little  Social Connections: Socially Isolated (07/06/2020)   Received from [facility] visits prior to 06/18/2022., [facility] visits prior to 06/18/2022.   Social  Connection and Isolation Panel [NHANES]    Frequency of Communication with Friends and Family: More than three times a week    Frequency of Social Gatherings with Friends and Family: Three times a week    Attends Religious Services: Never    Active Member of Clubs or Organizations: No    Attends Banker Meetings: Never    Marital Status: Separated  Intimate Partner Violence: Not At Risk (02/15/2023)   Humiliation, Afraid, Rape, and Kick questionnaire    Fear of Current or Ex-Partner: No    Emotionally Abused: No    Physically Abused: No    Sexually Abused: No    FAMILY HISTORY: Family  History  Problem Relation Age of Onset   Sudden Cardiac Death Mother    Dementia Mother    High blood pressure Father    Heart attack Maternal Grandmother    Stroke Maternal Grandfather    Colon cancer Neg Hx    Pancreatic cancer Neg Hx    Esophageal cancer Neg Hx    Liver disease Neg Hx    Stomach cancer Neg Hx     ALLERGIES:  has No Known Allergies.  MEDICATIONS:  Current Outpatient Medications  Medication Sig Dispense Refill   ALPRAZolam (XANAX) 0.25 MG tablet Take 0.25 mg by mouth as needed for anxiety.     [START ON 03/16/2023] apixaban (ELIQUIS) 5 MG TABS tablet Take 1 tablet (5 mg total) by mouth 2 (two) times daily. Save this prescription to fill later.  Take this once you've completed your eliquis starter pack. 60 tablet 1   atenolol (TENORMIN) 50 MG tablet TAKE 1 TABLET BY MOUTH EVERY DAY 90 tablet 3   fluticasone (FLONASE) 50 MCG/ACT nasal spray Place 2 sprays into both nostrils daily as needed for allergies.     Multiple Vitamin tablet Take 2 tablets by mouth at bedtime.     omeprazole (PRILOSEC) 40 MG capsule Take 40 mg by mouth daily.     No current facility-administered medications for this visit.    REVIEW OF SYSTEMS:    10 Point review of Systems was done is negative except as noted above.  PHYSICAL EXAMINATION: ECOG PERFORMANCE STATUS: {CHL ONC ECOG ZO:[id number]}  .There were no vitals filed for this visit. There were no vitals filed for this visit. .There is no height or weight on file to calculate BMI.  GENERAL:alert, in no acute distress and comfortable SKIN: no acute rashes, no significant lesions EYES: conjunctiva are pink and non-injected, sclera anicteric OROPHARYNX: MMM, no exudates, no oropharyngeal erythema or ulceration NECK: supple, no JVD LYMPH:  no palpable lymphadenopathy in the cervical, axillary or inguinal regions LUNGS: clear to auscultation b/l with normal respiratory effort HEART: regular rate & rhythm ABDOMEN:  normoactive bowel  sounds , non tender, not distended. Extremity: no pedal edema PSYCH: alert & oriented x 3 with fluent speech NEURO: no focal motor/sensory deficits  LABORATORY DATA:  I have reviewed the data as listed  .    Latest Ref Rng & Units 02/16/2023    5:27 AM 02/15/2023    5:25 AM 12/13/2021    2:13 PM  CBC  WBC 4.0 - 10.5 K/uL 3.5  3.7  4.0   Hemoglobin 12.0 - 15.0 g/dL 81.1  91.4  78.2   Hematocrit 36.0 - 46.0 % 39.7  37.2  43.4   Platelets 150 - 400 K/uL 107  113  135.0     .    Latest Ref Rng & Units  02/16/2023    5:27 AM 02/15/2023    5:25 AM 12/13/2021    2:13 PM  CMP  Glucose 70 - 99 mg/dL 85  829  90   BUN 8 - 23 mg/dL 10  13  12    Creatinine 0.44 - 1.00 mg/dL 5.62  1.30  8.65   Sodium 135 - 145 mmol/L 137  137  139   Potassium 3.5 - 5.1 mmol/L 3.9  4.0  3.9   Chloride 98 - 111 mmol/L 106  104  102   CO2 22 - 32 mmol/L 22  26  28    Calcium 8.9 - 10.3 mg/dL 9.1  9.7  9.9   Total Protein 6.5 - 8.1 g/dL  6.6  7.5   Total Bilirubin 0.3 - 1.2 mg/dL  0.7  0.8   Alkaline Phos 38 - 126 U/L  50  54   AST 15 - 41 U/L  17  21   ALT 0 - 44 U/L  15  23      RADIOGRAPHIC STUDIES: I have personally reviewed the radiological images as listed and agreed with the findings in the report. VAS [place] LOWER EXTREMITY VENOUS (DVT)  Result Date: 02/16/2023  Lower Venous DVT Study Patient Name:  ELANOR HORACEK  Date of Exam:   02/15/2023 Medical Rec #: [id number]        Accession #:    [id number] Date of Birth: 09-May-1960         Patient Gender: F Patient Age:   6 years Exam Location:  [facility] Procedure:      VAS [place] LOWER EXTREMITY VENOUS (DVT) Referring Phys: Madelyn Flavors --------------------------------------------------------------------------------  Indications: Pulmonary embolism.  Risk Factors: Confirmed PE DVT Over 15 years ago per patient. Comparison Study: No prior study Performing Technologist: Fernande Bras  Examination Guidelines: A complete evaluation includes B-mode  imaging, spectral Doppler, color Doppler, and power Doppler as needed of all accessible portions of each vessel. Bilateral testing is considered an integral part of a complete examination. Limited examinations for reoccurring indications may be performed as noted. The reflux portion of the exam is performed with the patient in reverse Trendelenburg.  +---------+---------------+---------+-----------+----------+--------------+ RIGHT    CompressibilityPhasicitySpontaneityPropertiesThrombus Aging +---------+---------------+---------+-----------+----------+--------------+ CFV      Full           Yes      Yes                                 +---------+---------------+---------+-----------+----------+--------------+ SFJ      Full                                                        +---------+---------------+---------+-----------+----------+--------------+ FV Prox  Full                                                        +---------+---------------+---------+-----------+----------+--------------+ FV Mid   Full                                                        +---------+---------------+---------+-----------+----------+--------------+  FV DistalFull                                                        +---------+---------------+---------+-----------+----------+--------------+ PFV      Full                                                        +---------+---------------+---------+-----------+----------+--------------+ POP      Full           Yes      Yes                                 +---------+---------------+---------+-----------+----------+--------------+ PTV      Full                                                        +---------+---------------+---------+-----------+----------+--------------+ PERO     Full                                                        +---------+---------------+---------+-----------+----------+--------------+    +---------+---------------+---------+-----------+----------+--------------+ LEFT     CompressibilityPhasicitySpontaneityPropertiesThrombus Aging +---------+---------------+---------+-----------+----------+--------------+ CFV      Full           Yes      Yes                                 +---------+---------------+---------+-----------+----------+--------------+ SFJ      Full                                                        +---------+---------------+---------+-----------+----------+--------------+ FV Prox  Full                                                        +---------+---------------+---------+-----------+----------+--------------+ FV Mid   Full                                                        +---------+---------------+---------+-----------+----------+--------------+ FV DistalFull                                                        +---------+---------------+---------+-----------+----------+--------------+  PFV      Full                                                        +---------+---------------+---------+-----------+----------+--------------+ POP      Full           Yes      Yes                                 +---------+---------------+---------+-----------+----------+--------------+ PTV      Full                                                        +---------+---------------+---------+-----------+----------+--------------+ PERO     Full                                                        +---------+---------------+---------+-----------+----------+--------------+     Summary: BILATERAL: - No evidence of deep vein thrombosis seen in the lower extremities, bilaterally. -No evidence of popliteal cyst, bilaterally.   *See table(s) above for measurements and observations. Electronically signed by Heath Lark on 02/16/2023 at 4:06:22 PM.    Final    ECHOCARDIOGRAM COMPLETE  Result Date: 02/15/2023     ECHOCARDIOGRAM REPORT   Patient Name:   MARGUERIETE BOOTEN Date of Exam: 02/15/2023 Medical Rec #:  [id number]       Height:       65.0 in Accession #:    [id number]      Weight:       166.1 lb Date of Birth:  11/25/1960        BSA:          1.828 m Patient Age:    62 years        BP:           121/85 mmHg Patient Gender: F               HR:           75 bpm. Exam Location:  Inpatient Procedure: 2D Echo, Color Doppler and Cardiac Doppler Indications:    Dyspnea  History:        Patient has prior history of Echocardiogram examinations, most                 recent 09/20/2022. Signs/Symptoms:Dyspnea; Risk                 Factors:Hypertension and Dyslipidemia.  Sonographer:    Milda Smart Referring Phys: Clydie Braun  Sonographer Comments: Image acquisition challenging due to patient body habitus and Image acquisition challenging due to respiratory motion. IMPRESSIONS  1. Left ventricular ejection fraction, by estimation, is 55 to 60%. Left ventricular ejection fraction by 2D MOD biplane is 58.1 %. The left ventricle has normal function. The left ventricle has no regional wall motion abnormalities. Left ventricular diastolic parameters are consistent with Grade I diastolic dysfunction (impaired relaxation).  2.  Right ventricular systolic function is normal. The right ventricular size is normal. Tricuspid regurgitation signal is inadequate for assessing PA pressure.  3. The mitral valve is grossly normal. Trivial mitral valve regurgitation. No evidence of mitral stenosis.  4. The aortic valve is tricuspid. Aortic valve regurgitation is mild. No aortic stenosis is present.  5. There is mild dilatation of the ascending aorta, measuring 40 mm.  6. The inferior vena cava is normal in size with greater than 50% respiratory variability, suggesting right atrial pressure of 3 mmHg. FINDINGS  Left Ventricle: Left ventricular ejection fraction, by estimation, is 55 to 60%. Left ventricular ejection fraction by 2D MOD biplane is  58.1 %. The left ventricle has normal function. The left ventricle has no regional wall motion abnormalities. The left ventricular internal cavity size was normal in size. There is no left ventricular hypertrophy. Left ventricular diastolic parameters are consistent with Grade I diastolic dysfunction (impaired relaxation). Right Ventricle: The right ventricular size is normal. No increase in right ventricular wall thickness. Right ventricular systolic function is normal. Tricuspid regurgitation signal is inadequate for assessing PA pressure. Left Atrium: Left atrial size was normal in size. Right Atrium: Right atrial size was normal in size. Pericardium: There is no evidence of pericardial effusion. Mitral Valve: The mitral valve is grossly normal. Trivial mitral valve regurgitation. No evidence of mitral valve stenosis. Tricuspid Valve: The tricuspid valve is grossly normal. Tricuspid valve regurgitation is trivial. No evidence of tricuspid stenosis. Aortic Valve: The aortic valve is tricuspid. Aortic valve regurgitation is mild. Aortic regurgitation PHT measures 460 msec. No aortic stenosis is present. Pulmonic Valve: The pulmonic valve was grossly normal. Pulmonic valve regurgitation is not visualized. No evidence of pulmonic stenosis. Aorta: The aortic root is normal in size and structure. There is mild dilatation of the ascending aorta, measuring 40 mm. Venous: The inferior vena cava is normal in size with greater than 50% respiratory variability, suggesting right atrial pressure of 3 mmHg. IAS/Shunts: The atrial septum is grossly normal.  LEFT VENTRICLE PLAX 2D                        Biplane EF (MOD) LVIDd:         4.50 cm         LV Biplane EF:   Left LVIDs:         3.10 cm                          ventricular LV PW:         0.90 cm                          ejection LV IVS:        0.90 cm                          fraction by LVOT diam:     2.10 cm                          2D MOD LV SV:         75                                biplane is LV SV Index:   41  58.1 %. LVOT Area:     3.46 cm                                Diastology                                LV e' medial:    7.07 cm/s LV Volumes (MOD)               LV E/e' medial:  10.1 LV vol d, MOD    60.4 ml       LV e' lateral:   7.40 cm/s A2C:                           LV E/e' lateral: 9.7 LV vol d, MOD    79.6 ml A4C: LV vol s, MOD    27.9 ml A2C: LV vol s, MOD    30.8 ml A4C: LV SV MOD A2C:   32.5 ml LV SV MOD A4C:   79.6 ml LV SV MOD BP:    40.8 ml RIGHT VENTRICLE             IVC RV Basal diam:  3.20 cm     IVC diam: 1.70 cm RV S prime:     10.10 cm/s TAPSE (M-mode): 2.4 cm LEFT ATRIUM             Index        RIGHT ATRIUM           Index LA diam:        2.50 cm 1.37 cm/m   RA Area:     11.90 cm LA Vol (A2C):   41.4 ml 22.68 ml/m  RA Volume:   23.90 ml  13.07 ml/m LA Vol (A4C):   31.7 ml 17.34 ml/m LA Biplane Vol: 36.3 ml 19.86 ml/m  AORTIC VALVE LVOT Vmax:   104.00 cm/s LVOT Vmean:  76.500 cm/s LVOT VTI:    0.216 m AI PHT:      460 msec  AORTA Ao Root diam: 3.80 cm Ao Asc diam:  4.00 cm MITRAL VALVE MV Area (PHT): 2.75 cm    SHUNTS MV Decel Time: 276 msec    Systemic VTI:  0.22 m MR Peak grad: 49.6 mmHg    Systemic Diam: 2.10 cm MR Vmax:      352.00 cm/s MV E velocity: 71.60 cm/s MV A velocity: 94.30 cm/s MV E/A ratio:  0.76 Lennie Odor MD Electronically signed by Lennie Odor MD Signature Date/Time: 02/15/2023/3:36:32 PM    Final    CT Angio Chest Pulmonary Embolism (PE) W or WO Contrast  Result Date: 02/15/2023 CLINICAL DATA:  Shortness of breath EXAM: CT ANGIOGRAPHY CHEST WITH CONTRAST TECHNIQUE: Multidetector CT imaging of the chest was performed using the standard protocol during bolus administration of intravenous contrast. Multiplanar CT image reconstructions and MIPs were obtained to evaluate the vascular anatomy. RADIATION DOSE REDUCTION: This exam was performed according to the departmental dose-optimization  program which includes automated exposure control, adjustment of the mA and/or kV according to patient size and/or use of iterative reconstruction technique. CONTRAST:  75mL OMNIPAQUE IOHEXOL 350 MG/ML SOLN COMPARISON:  Same day chest radiograph, cardiac CT 12/15/2020 FINDINGS: Cardiovascular: There is adequate opacification of the pulmonary arteries to the segmental level. There is a moderate burden of pulmonary  emboli extending from the right main pulmonary artery to the segmental and subsegmental branches as well as in the left upper and lower lobe segmental and subsegmental pulmonary arteries. There is no evidence of right heart strain the heart size is normal. There is no pericardial effusion. There is mild calcified plaque in the thoracic aorta. Mediastinum/Nodes: The imaged thyroid is unremarkable. The esophagus is grossly unremarkable. There is no mediastinal, hilar, or axillary lymphadenopathy. Lungs/Pleura: The trachea and central airways are patent. The lungs are well inflated. There is minimal dependent subsegmental atelectasis bilaterally. There is no focal consolidation or pulmonary edema. There is no evidence of pulmonary infarction. Mosaic attenuation in the lungs may reflect air trapping/small airway disease. There is no pleural effusion or pneumothorax. There is a 7 mm nodule in the right lung apex (7-28). 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 The imaged upper abdominal viscera are otherwise unremarkable. Musculoskeletal: There is no acute osseous abnormality or suspicious osseous lesion. Review of the MIP images confirms the above findings. IMPRESSION: 1. Moderate burden of acute pulmonary emboli bilaterally without evidence of right heart strain. 2. 7 mm nodule in the right lung apex. Non-contrast chest CT at 6-12 months is recommended. If the nodule is stable at time of repeat CT, then future CT at 18-24 months (from  today's scan) is considered optional for low-risk patients, but is recommended for high-risk patients. 3. Mosaic attenuation in the lungs suggesting air trapping/small airway disease. 4. 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 Impression pont #1 was called by telephone at the time of interpretation on 02/15/2023 at 12:58 pm to provider [facility] , who verbally acknowledged these results. Electronically Signed   By: Lesia Hausen M.D.   On: 02/15/2023 12:58   DG Chest Port 1 View  Result Date: 02/15/2023 CLINICAL DATA:  62 year old female with history of epigastric pain. EXAM: PORTABLE CHEST 1 VIEW COMPARISON:  Chest x-ray 09/19/2018. FINDINGS: Lung volumes are normal. No consolidative airspace disease. No pleural effusions. No pneumothorax. No pulmonary nodule or mass noted. Pulmonary vasculature and the cardiomediastinal silhouette are within normal limits. Atherosclerosis in the thoracic aorta. IMPRESSION: 1.  No radiographic evidence of acute cardiopulmonary disease. 2. Aortic atherosclerosis. Electronically Signed   By: Trudie Reed M.D.   On: 02/15/2023 06:04    ASSESSMENT & PLAN:  62 y.o. female with:  History of pulmonary embolism Pancytopenia  PLAN:  FOLLOW-UP: ***  The total time spent in the appointment was *** minutes* .  All of the patient's questions were answered with apparent satisfaction. The patient knows to call the clinic with any problems, questions or concerns.   Wyvonnia Lora MD MS AAHIVMS [facility] Hematology/Oncology Physician [facility]  .*Total Encounter Time as defined by the Centers for Medicare and Medicaid Services includes, in addition to the face-to-face time of a patient visit (documented in the note above) non-face-to-face time: obtaining and reviewing outside history, ordering and reviewing medications, tests or procedures, care coordination (communications with other health care professionals or caregivers) and  documentation in the medical record.   I,Mitra Faeizi,acting as a Neurosurgeon for Wyvonnia Lora, MD.,have documented all relevant documentation on the behalf of Wyvonnia Lora, MD,as directed by  Wyvonnia Lora, MD while in the presence of Wyvonnia Lora, MD.  ***

## 2023-03-02 NOTE — Progress Notes (Incomplete)
HEMATOLOGY/ONCOLOGY CONSULTATION NOTE  Date of Service: 03/02/2023  Patient Care Team: Richmond Campbell., PA-C as PCP - General (Family Medicine) Elder Negus, MD as PCP - Cardiology (Cardiology)  CHIEF COMPLAINTS/PURPOSE OF CONSULTATION:  evaluation and management of history of pulmonary embolism, pancytopenia  HISTORY OF PRESENTING ILLNESS:  Rachel Mccarty is a wonderful 62 y.o. female who has been referred to [place] by Richmond Campbell, PA-C for evaluation and management of history of pulmonary embolism, pancytopenia.   She presented to the ED on 02/15/2023 for shortness of breath. Labs showed WBC 3.7 and platelets 113K. Chest x-ray showed no acute abnormality.   Today she reports that blood clots presented suddently. Blood clot in leg be 4 from birth control in 2004. Though prokeoked yb oral ocontraceptives. It was in left below the knee.  It was cleared temporarily with 3-6 moths anticoagulation and no other medication  They did not due any other testing.   No fhx of blood clots on mom side. Unsure of her fathers side    Never smoker. And hisotry refluz, asthma, no major heart lung kidney problems  Generally feeling well overall. [place] legs 2021 showed venous reflux. Her ankles were swelling on reg basis. She has been generally using compression socks when working at home.   -graded sports compression option  She reports that Thursday b4 Got up and sudden SOB 10/20 Friday felt lousy  Triggered her to ED  No new leg swelling or pain. No new abdominla pain. She did the Tuesday b4 that thurday she was in car 4.5 hours round trip to [place].   This was 2 days before having symptoms.   Left ankles swells more than right and stbale for while  No new medication, high dose steroids, hormonal exposure, new recent immobility,   No recent surgeries,   Nothing different in last month    She did not take a break during he rtravels  She is on eliquis en and  breathing good  Still SOB on exertion sometimes.   -clot dissolve 4-6 weeks. Bood thinners for at least 6 months may be depedning on provoed or  -may be long term if unexplained  -discussed her local risk factor for clots of chornic venous reflux in bilateral legs.  -traveling risk factor, though not completely story  -will evaluate for any acquired antibodies as well as other testing  Given that more extensive clot - not just [place].  Liekly a sizable explantion  -by default long term   -clear explanation, risk of repeat clots off thinners is 2-2.5% a year  -if unproked clot, rish is higher  Risk of bleeding lower   -blood tests  -UTS age approprate cancer screning colon, pap, mammogran Normal. No fhx of cancer  Small nodule in right lung is scheduled for follow up.   Priuary has ordered CT scan  She denies any sudden sduedn eun expected weight loss, recent severe COVID infection, change in bowel, change urination, new abodinal pain/distention  -answered all of paitnet's questions in detail  -genetic testing  -phone visit in couple weeks  -rec long term if no obv explanation  No evolved symptoms, no concerns on CT scan after 6 months. If nothing on testing, maybe consider prevnetative dose of blood thinner  She reports that she was not on aspirin while she had a blood clot.   She reports that she does not frequently travel. Patient denies any hobbies involving an increased risk of bleeding.  Her breathing has majority returned to baseline for most part. But only issues on exersion occasionally.   Gas and bloat since being on eliquis. A week after  Patient reports   No balck stols or blood in stools  Stomach issues for a while. Went to [employer] and no reason for cause of bleating.   -discussed option of digestive enzyme or lactase for bleating, pancreatic enzyme supplement.   -option ot Keeping food diary  -chornaly on acid suppressient and probiotics  may change bacteria in gut  -plenty of fresh fruits and vegetables to keep bowel movements regulalr  -recommend infant probiotic. Quarter of teasppon mixed with yogurt or align probiotic or good culture yogurts   -recommend simple diet -limit foods with preservatives  She still has gallbladder in place.   Last November COVID-19. Not since. Not bad enough hosptialtion,           -CT angio chest PE scan on 02/15/2023 showed: 1. Moderate burden of acute pulmonary emboli bilaterally without evidence of right heart strain. 2. 7 mm nodule in the right lung apex. Non-contrast chest CT at 6-12 months is recommended. If the nodule is stable at time of repeat CT, then future CT at 18-24 months (from today's scan) is considered optional for low-risk patients, but is recommended for high-risk patients. 3. Mosaic attenuation in the lungs suggesting air trapping/small airway disease. 4. 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  Small nodle right apex will have to monitor  -no obv DVT on [place] in 2021 copt the conclusion of that. Showed no DVT. But venous reflux in *** and *** Lots of venous reflux in lower extremities     -CBC from 02/16/2023 showed WBC of 3.5K, hemoglobin of 13.5, and platelets of 107K. -no anemia, platelets normal, WBC borderline    MEDICAL HISTORY:  Past Medical History:  Diagnosis Date  . Acid reflux   . Hyperlipidemia   . Hypertension   . Palpitations     SURGICAL HISTORY: Past Surgical History:  Procedure Laterality Date  . COLONOSCOPY  2013  . OVARIAN CYST SURGERY    . UPPER GI ENDOSCOPY  2018    SOCIAL HISTORY: Social History   Socioeconomic History  . Marital status: Legally Separated    Spouse name: Not on file  . Number of children: 2  . Years of education: Not on file  . Highest education level: Not on file  Occupational History  . Not on file  Tobacco Use  . Smoking status: Never  . Smokeless tobacco: Never   Vaping Use  . Vaping status: Never Used  Substance and Sexual Activity  . Alcohol use: Yes    Alcohol/week: 1.0 standard drink of alcohol    Types: 1 Cans of beer per week    Comment: 1 drink per day  . Drug use: Never  . Sexual activity: Not on file  Other Topics Concern  . Not on file  Social History Narrative  . Not on file   Social Determinants of Health   Financial Resource Strain: Low Risk  (07/06/2020)   [facility] from [facility] visits prior to 06/18/2022., [facility] visits prior to 06/18/2022.   Overall Physicist, medical Strain (CARDIA)   . Difficulty of Paying Living Expenses: Not hard at all  Food Insecurity: No Food Insecurity (02/15/2023)   Hunger Vital Sign   . Worried About Programme researcher, broadcasting/film/video in the Last Year: Never true   .  Ran Out of Food in the Last Year: Never true  Transportation Needs: No Transportation Needs (02/15/2023)   PRAPARE - Transportation   . Lack of Transportation (Medical): No   . Lack of Transportation (Non-Medical): No  Physical Activity: Insufficiently Active (07/06/2020)   Received from [facility] visits prior to 06/18/2022., [facility] visits prior to 06/18/2022.   Exercise Vital Sign   . Days of Exercise per Week: 3 days   . Minutes of Exercise per Session: 20 min  Stress: No Stress Concern Present (07/06/2020)   Received from [facility] visits prior to 06/18/2022., [facility] visits prior to 06/18/2022.   [employer] of Occupational Health - Occupational Stress Questionnaire   . Feeling of Stress : Only a little  Social Connections: Socially Isolated (07/06/2020)   Received from [facility] visits prior to 06/18/2022., [facility] visits prior to 06/18/2022.   Social Connection and Isolation Panel [NHANES]   . Frequency of Communication with Friends and Family: More  than three times a week   . Frequency of Social Gatherings with Friends and Family: Three times a week   . Attends Religious Services: Never   . Active Member of Clubs or Organizations: No   . Attends Banker Meetings: Never   . Marital Status: Separated  Intimate Partner Violence: Not At Risk (02/15/2023)   Humiliation, Afraid, Rape, and Kick questionnaire   . Fear of Current or Ex-Partner: No   . Emotionally Abused: No   . Physically Abused: No   . Sexually Abused: No    FAMILY HISTORY: Family History  Problem Relation Age of Onset  . Sudden Cardiac Death Mother   . Dementia Mother   . High blood pressure Father   . Heart attack Maternal Grandmother   . Stroke Maternal Grandfather   . Colon cancer Neg Hx   . Pancreatic cancer Neg Hx   . Esophageal cancer Neg Hx   . Liver disease Neg Hx   . Stomach cancer Neg Hx     ALLERGIES:  has No Known Allergies.  MEDICATIONS:  Current Outpatient Medications  Medication Sig Dispense Refill  . ALPRAZolam (XANAX) 0.25 MG tablet Take 0.25 mg by mouth as needed for anxiety.    Melene Muller ON 03/16/2023] apixaban (ELIQUIS) 5 MG TABS tablet Take 1 tablet (5 mg total) by mouth 2 (two) times daily. Save this prescription to fill later.  Take this once you've completed your eliquis starter pack. 60 tablet 1  . atenolol (TENORMIN) 50 MG tablet TAKE 1 TABLET BY MOUTH EVERY DAY 90 tablet 3  . fluticasone (FLONASE) 50 MCG/ACT nasal spray Place 2 sprays into both nostrils daily as needed for allergies.    . Multiple Vitamin tablet Take 2 tablets by mouth at bedtime.    Marland Kitchen omeprazole (PRILOSEC) 40 MG capsule Take 40 mg by mouth daily.     No current facility-administered medications for this visit.    REVIEW OF SYSTEMS:    10 Point review of Systems was done is negative except as noted above.  PHYSICAL EXAMINATION: ECOG PERFORMANCE STATUS: {CHL ONC ECOG ON:[id number]}  .There were no vitals filed for this visit. There were no  vitals filed for this visit. .There is no height or weight on file to calculate BMI.  GENERAL:alert, in no acute distress and comfortable SKIN: no acute rashes, no significant lesions EYES: conjunctiva are pink  and non-injected, sclera anicteric OROPHARYNX: MMM, no exudates, no oropharyngeal erythema or ulceration NECK: supple, no JVD LYMPH:  no palpable lymphadenopathy in the cervical, axillary or inguinal regions LUNGS: clear to auscultation b/l with normal respiratory effort HEART: regular rate & rhythm ABDOMEN:  normoactive bowel sounds , non tender, not distended. Extremity: no pedal edema PSYCH: alert & oriented x 3 with fluent speech NEURO: no focal motor/sensory deficits  LABORATORY DATA:  I have reviewed the data as listed  .    Latest Ref Rng & Units 02/16/2023    5:27 AM 02/15/2023    5:25 AM 12/13/2021    2:13 PM  CBC  WBC 4.0 - 10.5 K/uL 3.5  3.7  4.0   Hemoglobin 12.0 - 15.0 g/dL 16.1  09.6  04.5   Hematocrit 36.0 - 46.0 % 39.7  37.2  43.4   Platelets 150 - 400 K/uL 107  113  135.0     .    Latest Ref Rng & Units 02/16/2023    5:27 AM 02/15/2023    5:25 AM 12/13/2021    2:13 PM  CMP  Glucose 70 - 99 mg/dL 85  409  90   BUN 8 - 23 mg/dL 10  13  12    Creatinine 0.44 - 1.00 mg/dL 8.11  9.14  7.82   Sodium 135 - 145 mmol/L 137  137  139   Potassium 3.5 - 5.1 mmol/L 3.9  4.0  3.9   Chloride 98 - 111 mmol/L 106  104  102   CO2 22 - 32 mmol/L 22  26  28    Calcium 8.9 - 10.3 mg/dL 9.1  9.7  9.9   Total Protein 6.5 - 8.1 g/dL  6.6  7.5   Total Bilirubin 0.3 - 1.2 mg/dL  0.7  0.8   Alkaline Phos 38 - 126 U/L  50  54   AST 15 - 41 U/L  17  21   ALT 0 - 44 U/L  15  23      RADIOGRAPHIC STUDIES: I have personally reviewed the radiological images as listed and agreed with the findings in the report. VAS [place] LOWER EXTREMITY VENOUS (DVT)  Result Date: 02/16/2023  Lower Venous DVT Study Patient Name:  Rachel Mccarty  Date of Exam:   02/15/2023 Medical Rec #:  [id number]        Accession #:    [id number] Date of Birth: 1960/10/24         Patient Gender: F Patient Age:   63 years Exam Location:  [facility] Procedure:      VAS [place] LOWER EXTREMITY VENOUS (DVT) Referring Phys: Madelyn Flavors --------------------------------------------------------------------------------  Indications: Pulmonary embolism.  Risk Factors: Confirmed PE DVT Over 15 years ago per patient. Comparison Study: No prior study Performing Technologist: Fernande Bras  Examination Guidelines: A complete evaluation includes B-mode imaging, spectral Doppler, color Doppler, and power Doppler as needed of all accessible portions of each vessel. Bilateral testing is considered an integral part of a complete examination. Limited examinations for reoccurring indications may be performed as noted. The reflux portion of the exam is performed with the patient in reverse Trendelenburg.  +---------+---------------+---------+-----------+----------+--------------+ RIGHT    CompressibilityPhasicitySpontaneityPropertiesThrombus Aging +---------+---------------+---------+-----------+----------+--------------+ CFV      Full           Yes      Yes                                 +---------+---------------+---------+-----------+----------+--------------+  SFJ      Full                                                        +---------+---------------+---------+-----------+----------+--------------+ FV Prox  Full                                                        +---------+---------------+---------+-----------+----------+--------------+ FV Mid   Full                                                        +---------+---------------+---------+-----------+----------+--------------+ FV DistalFull                                                        +---------+---------------+---------+-----------+----------+--------------+ PFV      Full                                                         +---------+---------------+---------+-----------+----------+--------------+ POP      Full           Yes      Yes                                 +---------+---------------+---------+-----------+----------+--------------+ PTV      Full                                                        +---------+---------------+---------+-----------+----------+--------------+ PERO     Full                                                        +---------+---------------+---------+-----------+----------+--------------+   +---------+---------------+---------+-----------+----------+--------------+ LEFT     CompressibilityPhasicitySpontaneityPropertiesThrombus Aging +---------+---------------+---------+-----------+----------+--------------+ CFV      Full           Yes      Yes                                 +---------+---------------+---------+-----------+----------+--------------+ SFJ      Full                                                        +---------+---------------+---------+-----------+----------+--------------+  FV Prox  Full                                                        +---------+---------------+---------+-----------+----------+--------------+ FV Mid   Full                                                        +---------+---------------+---------+-----------+----------+--------------+ FV DistalFull                                                        +---------+---------------+---------+-----------+----------+--------------+ PFV      Full                                                        +---------+---------------+---------+-----------+----------+--------------+ POP      Full           Yes      Yes                                 +---------+---------------+---------+-----------+----------+--------------+ PTV      Full                                                         +---------+---------------+---------+-----------+----------+--------------+ PERO     Full                                                        +---------+---------------+---------+-----------+----------+--------------+     Summary: BILATERAL: - No evidence of deep vein thrombosis seen in the lower extremities, bilaterally. -No evidence of popliteal cyst, bilaterally.   *See table(s) above for measurements and observations. Electronically signed by Heath Lark on 02/16/2023 at 4:06:22 PM.    Final    ECHOCARDIOGRAM COMPLETE  Result Date: 02/15/2023    ECHOCARDIOGRAM REPORT   Patient Name:   Rachel Mccarty Date of Exam: 02/15/2023 Medical Rec #:  [id number]       Height:       65.0 in Accession #:    [id number]      Weight:       166.1 lb Date of Birth:  January 29, 1961        BSA:          1.828 m Patient Age:    62 years        BP:           121/85 mmHg Patient Gender: F  HR:           75 bpm. Exam Location:  Inpatient Procedure: 2D Echo, Color Doppler and Cardiac Doppler Indications:    Dyspnea  History:        Patient has prior history of Echocardiogram examinations, most                 recent 09/20/2022. Signs/Symptoms:Dyspnea; Risk                 Factors:Hypertension and Dyslipidemia.  Sonographer:    Milda Smart Referring Phys: Clydie Braun  Sonographer Comments: Image acquisition challenging due to patient body habitus and Image acquisition challenging due to respiratory motion. IMPRESSIONS  1. Left ventricular ejection fraction, by estimation, is 55 to 60%. Left ventricular ejection fraction by 2D MOD biplane is 58.1 %. The left ventricle has normal function. The left ventricle has no regional wall motion abnormalities. Left ventricular diastolic parameters are consistent with Grade I diastolic dysfunction (impaired relaxation).  2. Right ventricular systolic function is normal. The right ventricular size is normal. Tricuspid regurgitation signal is inadequate for assessing PA  pressure.  3. The mitral valve is grossly normal. Trivial mitral valve regurgitation. No evidence of mitral stenosis.  4. The aortic valve is tricuspid. Aortic valve regurgitation is mild. No aortic stenosis is present.  5. There is mild dilatation of the ascending aorta, measuring 40 mm.  6. The inferior vena cava is normal in size with greater than 50% respiratory variability, suggesting right atrial pressure of 3 mmHg. FINDINGS  Left Ventricle: Left ventricular ejection fraction, by estimation, is 55 to 60%. Left ventricular ejection fraction by 2D MOD biplane is 58.1 %. The left ventricle has normal function. The left ventricle has no regional wall motion abnormalities. The left ventricular internal cavity size was normal in size. There is no left ventricular hypertrophy. Left ventricular diastolic parameters are consistent with Grade I diastolic dysfunction (impaired relaxation). Right Ventricle: The right ventricular size is normal. No increase in right ventricular wall thickness. Right ventricular systolic function is normal. Tricuspid regurgitation signal is inadequate for assessing PA pressure. Left Atrium: Left atrial size was normal in size. Right Atrium: Right atrial size was normal in size. Pericardium: There is no evidence of pericardial effusion. Mitral Valve: The mitral valve is grossly normal. Trivial mitral valve regurgitation. No evidence of mitral valve stenosis. Tricuspid Valve: The tricuspid valve is grossly normal. Tricuspid valve regurgitation is trivial. No evidence of tricuspid stenosis. Aortic Valve: The aortic valve is tricuspid. Aortic valve regurgitation is mild. Aortic regurgitation PHT measures 460 msec. No aortic stenosis is present. Pulmonic Valve: The pulmonic valve was grossly normal. Pulmonic valve regurgitation is not visualized. No evidence of pulmonic stenosis. Aorta: The aortic root is normal in size and structure. There is mild dilatation of the ascending aorta, measuring 40  mm. Venous: The inferior vena cava is normal in size with greater than 50% respiratory variability, suggesting right atrial pressure of 3 mmHg. IAS/Shunts: The atrial septum is grossly normal.  LEFT VENTRICLE PLAX 2D                        Biplane EF (MOD) LVIDd:         4.50 cm         LV Biplane EF:   Left LVIDs:         3.10 cm  ventricular LV PW:         0.90 cm                          ejection LV IVS:        0.90 cm                          fraction by LVOT diam:     2.10 cm                          2D MOD LV SV:         75                               biplane is LV SV Index:   41                               58.1 %. LVOT Area:     3.46 cm                                Diastology                                LV e' medial:    7.07 cm/s LV Volumes (MOD)               LV E/e' medial:  10.1 LV vol d, MOD    60.4 ml       LV e' lateral:   7.40 cm/s A2C:                           LV E/e' lateral: 9.7 LV vol d, MOD    79.6 ml A4C: LV vol s, MOD    27.9 ml A2C: LV vol s, MOD    30.8 ml A4C: LV SV MOD A2C:   32.5 ml LV SV MOD A4C:   79.6 ml LV SV MOD BP:    40.8 ml RIGHT VENTRICLE             IVC RV Basal diam:  3.20 cm     IVC diam: 1.70 cm RV S prime:     10.10 cm/s TAPSE (M-mode): 2.4 cm LEFT ATRIUM             Index        RIGHT ATRIUM           Index LA diam:        2.50 cm 1.37 cm/m   RA Area:     11.90 cm LA Vol (A2C):   41.4 ml 22.68 ml/m  RA Volume:   23.90 ml  13.07 ml/m LA Vol (A4C):   31.7 ml 17.34 ml/m LA Biplane Vol: 36.3 ml 19.86 ml/m  AORTIC VALVE LVOT Vmax:   104.00 cm/s LVOT Vmean:  76.500 cm/s LVOT VTI:    0.216 m AI PHT:      460 msec  AORTA Ao Root diam: 3.80 cm Ao Asc diam:  4.00 cm MITRAL VALVE MV Area (PHT): 2.75 cm    SHUNTS MV Decel Time: 276 msec    Systemic  VTI:  0.22 m MR Peak grad: 49.6 mmHg    Systemic Diam: 2.10 cm MR Vmax:      352.00 cm/s MV E velocity: 71.60 cm/s MV A velocity: 94.30 cm/s MV E/A ratio:  0.76 Lennie Odor MD Electronically signed  by Lennie Odor MD Signature Date/Time: 02/15/2023/3:36:32 PM    Final    CT Angio Chest Pulmonary Embolism (PE) W or WO Contrast  Result Date: 02/15/2023 CLINICAL DATA:  Shortness of breath EXAM: CT ANGIOGRAPHY CHEST WITH CONTRAST TECHNIQUE: Multidetector CT imaging of the chest was performed using the standard protocol during bolus administration of intravenous contrast. Multiplanar CT image reconstructions and MIPs were obtained to evaluate the vascular anatomy. RADIATION DOSE REDUCTION: This exam was performed according to the departmental dose-optimization program which includes automated exposure control, adjustment of the mA and/or kV according to patient size and/or use of iterative reconstruction technique. CONTRAST:  75mL OMNIPAQUE IOHEXOL 350 MG/ML SOLN COMPARISON:  Same day chest radiograph, cardiac CT 12/15/2020 FINDINGS: Cardiovascular: There is adequate opacification of the pulmonary arteries to the segmental level. There is a moderate burden of pulmonary emboli extending from the right main pulmonary artery to the segmental and subsegmental branches as well as in the left upper and lower lobe segmental and subsegmental pulmonary arteries. There is no evidence of right heart strain the heart size is normal. There is no pericardial effusion. There is mild calcified plaque in the thoracic aorta. Mediastinum/Nodes: The imaged thyroid is unremarkable. The esophagus is grossly unremarkable. There is no mediastinal, hilar, or axillary lymphadenopathy. Lungs/Pleura: The trachea and central airways are patent. The lungs are well inflated. There is minimal dependent subsegmental atelectasis bilaterally. There is no focal consolidation or pulmonary edema. There is no evidence of pulmonary infarction. Mosaic attenuation in the lungs may reflect air trapping/small airway disease. There is no pleural effusion or pneumothorax. There is a 7 mm nodule in the right lung apex (7-28). Upper Abdomen: There is a  peripherally calcified lobulated left splenic artery aneurysm measuring 1.3 cm x 1.1 cm. The imaged upper abdominal viscera are otherwise unremarkable. Musculoskeletal: There is no acute osseous abnormality or suspicious osseous lesion. Review of the MIP images confirms the above findings. IMPRESSION: 1. Moderate burden of acute pulmonary emboli bilaterally without evidence of right heart strain. 2. 7 mm nodule in the right lung apex. Non-contrast chest CT at 6-12 months is recommended. If the nodule is stable at time of repeat CT, then future CT at 18-24 months (from today's scan) is considered optional for low-risk patients, but is recommended for high-risk patients. 3. Mosaic attenuation in the lungs suggesting air trapping/small airway disease. 4. 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 Impression pont #1 was called by telephone at the time of interpretation on 02/15/2023 at 12:58 pm to provider [facility] , who verbally acknowledged these results. Electronically Signed   By: Lesia Hausen M.D.   On: 02/15/2023 12:58   DG Chest Port 1 View  Result Date: 02/15/2023 CLINICAL DATA:  62 year old female with history of epigastric pain. EXAM: PORTABLE CHEST 1 VIEW COMPARISON:  Chest x-ray 09/19/2018. FINDINGS: Lung volumes are normal. No consolidative airspace disease. No pleural effusions. No pneumothorax. No pulmonary nodule or mass noted. Pulmonary vasculature and the cardiomediastinal silhouette are within normal limits. Atherosclerosis in the thoracic aorta. IMPRESSION: 1.  No radiographic evidence of acute cardiopulmonary disease. 2. Aortic atherosclerosis. Electronically Signed   By: Trudie Reed M.D.   On: 02/15/2023 06:04    ASSESSMENT & PLAN:  62 y.o. female with:  History of pulmonary embolism Pancytopenia  PLAN:  FOLLOW-UP: ***  The total time spent in the appointment was *** minutes* .  All of the patient's questions were answered with apparent satisfaction. The  patient knows to call the clinic with any problems, questions or concerns.   Wyvonnia Lora MD MS AAHIVMS [facility] Hematology/Oncology Physician [facility]  .*Total Encounter Time as defined by the Centers for Medicare and Medicaid Services includes, in addition to the face-to-face time of a patient visit (documented in the note above) non-face-to-face time: obtaining and reviewing outside history, ordering and reviewing medications, tests or procedures, care coordination (communications with other health care professionals or caregivers) and documentation in the medical record.   I,Mitra Faeizi,acting as a Neurosurgeon for Wyvonnia Lora, MD.,have documented all relevant documentation on the behalf of Wyvonnia Lora, MD,as directed by  Wyvonnia Lora, MD while in the presence of Wyvonnia Lora, MD.  ***

## 2023-03-03 LAB — LUPUS ANTICOAGULANT PANEL
DRVVT: 62 s — ABNORMAL HIGH (ref 0.0–47.0)
PTT Lupus Anticoagulant: 33.1 s (ref 0.0–43.5)

## 2023-03-03 LAB — BETA-2-GLYCOPROTEIN I ABS, IGG/M/A
Beta-2 Glyco I IgG: <9 GPI IgG units (ref 0–20)
Beta-2-Glycoprotein I IgA: <9 GPI IgA units (ref 0–25)
Beta-2-Glycoprotein I IgM: <9 GPI IgM units (ref 0–32)

## 2023-03-03 LAB — CARDIOLIPIN ANTIBODIES, IGG, IGM, IGA
Anticardiolipin IgA: <9 U/mL (ref 0–11)
Anticardiolipin IgG: <9 [GPL'U]/mL (ref 0–14)
Anticardiolipin IgM: <9 [MPL'U]/mL (ref 0–12)

## 2023-03-03 LAB — HOMOCYSTEINE: Homocysteine: 15.2 umol/L (ref 0.0–17.2)

## 2023-03-03 LAB — DRVVT MIX: dRVVT Mix: 50.9 s — ABNORMAL HIGH (ref 0.0–40.4)

## 2023-03-03 LAB — PROTEIN C ACTIVITY: Protein C Activity: 147 % (ref 73–180)

## 2023-03-03 LAB — PROTEIN S ACTIVITY: Protein S Activity: 119 % (ref 63–140)

## 2023-03-03 LAB — PROTEIN S, TOTAL: Protein S Ag, Total: 94 % (ref 60–150)

## 2023-03-03 LAB — DRVVT CONFIRM: dRVVT Confirm: 1 {ratio} (ref 0.8–1.2)

## 2023-03-04 LAB — PROTEIN C, TOTAL: Protein C, Total: 81 % (ref 60–150)

## 2023-03-06 LAB — MULTIPLE MYELOMA PANEL, SERUM
Albumin SerPl Elph-Mcnc: 4.5 g/dL — ABNORMAL HIGH (ref 2.9–4.4)
Albumin/Glob SerPl: 1.7 (ref 0.7–1.7)
Alpha 1: 0.2 g/dL (ref 0.0–0.4)
Alpha2 Glob SerPl Elph-Mcnc: 0.5 g/dL (ref 0.4–1.0)
B-Globulin SerPl Elph-Mcnc: 0.9 g/dL (ref 0.7–1.3)
Gamma Glob SerPl Elph-Mcnc: 1.1 g/dL (ref 0.4–1.8)
Globulin, Total: 2.7 g/dL (ref 2.2–3.9)
IgA: 135 mg/dL (ref 87–352)
IgG (Immunoglobin G), Serum: 1183 mg/dL (ref 586–1602)
IgM (Immunoglobulin M), Srm: 96 mg/dL (ref 26–217)
M Protein SerPl Elph-Mcnc: 0.3 g/dL — ABNORMAL HIGH
Total Protein ELP: 7.2 g/dL (ref 6.0–8.5)

## 2023-03-07 LAB — PROTHROMBIN GENE MUTATION

## 2023-03-13 LAB — FACTOR 5 LEIDEN

## 2023-04-07 ENCOUNTER — Inpatient Hospital Stay: Payer: 59 | Attending: Hematology | Admitting: Hematology

## 2023-04-07 DIAGNOSIS — D61818 Other pancytopenia: Secondary | ICD-10-CM | POA: Diagnosis not present

## 2023-04-07 DIAGNOSIS — D696 Thrombocytopenia, unspecified: Secondary | ICD-10-CM | POA: Diagnosis not present

## 2023-04-07 DIAGNOSIS — Z86711 Personal history of pulmonary embolism: Secondary | ICD-10-CM | POA: Diagnosis present

## 2023-04-07 DIAGNOSIS — Z86718 Personal history of other venous thrombosis and embolism: Secondary | ICD-10-CM | POA: Insufficient documentation

## 2023-04-07 DIAGNOSIS — D472 Monoclonal gammopathy: Secondary | ICD-10-CM | POA: Diagnosis not present

## 2023-04-07 DIAGNOSIS — I2694 Multiple subsegmental pulmonary emboli without acute cor pulmonale: Secondary | ICD-10-CM

## 2023-04-07 NOTE — Progress Notes (Signed)
HEMATOLOGY/ONCOLOGY CONSULTATION NOTE  Date of Service: 04/07/2023  Patient Care Team: Richmond Campbell., PA-C as PCP - General (Family Medicine) Elder Negus, MD as PCP - Cardiology (Cardiology)  CHIEF COMPLAINTS/PURPOSE OF CONSULTATION:  Evaluation and management of history of pulmonary embolism, pancytopenia  HISTORY OF PRESENTING ILLNESS:   STEPHIANE Mccarty is a wonderful 62 y.o. female who has been referred to [place] by Richmond Campbell, PA-C for evaluation and management of history of pulmonary embolism, pancytopenia.   She presented to the ED on 02/15/2023 for shortness of breath. Labs showed WBC 3.7 and platelets 113K. Chest x-ray showed no acute abnormality.   Today she reports that her blood clots presented suddenly. Patient previously had a blood clot in her left leg below the knee in 2004 thought to be provoked by oral contraceptives. It was cleared temporarily with 3-6 months of anticoagulation and she was not on any other medication as part of treatment.   Patient reports that two days before her blood clot, she had a 4.5 hour car ride round trip to [place] and back. Patient did not take a rest break during her travel. She notes that she forgot to mention this to ED staff. She reports that she does not frequently travel. Patient denies any hobbies involving an increased risk of bleeding.   Patient reports that the day before her hospitalization, she experienced sudden SOB after standing. She reports that she felt poorly the next day triggering her to present to the ED. She reports that she was not on aspirin while she had a blood clot. She is on Eliquis at this time.   Patient reports no fhx of blood clots on her maternal side of the family, but is unsure in regards to her paternal side.   She is a never smoker. Patient does have a history of acid reflux and asthma, but denies any no major heart, lung, or kidney problems. Patient generally feels well overall.    Venous US of lower extremities in 2021 showed findings of venous reflux in lower extremities. She reports that her ankles were swelling on a regular basis. She has been generally using compression socks when working at home.   She reports that her left ankle swells more than the right and has been stable for a while. She denies any new leg swelling or pain. No new abdominal pain.  She denies any new medication, high dose steroids, hormonal exposure, new recent immobility, or recent surgeries.   Patient was infected with COVID-19 last November. Her symptoms were not severe and she did not require hospitalization. Her breathing has returned to her baseline for the most part. She only has SOB on exersion sometimes.   Patient complains of bloating and gas since being on eliquis. Patient denies any black stools or blood in stools. She has had stomach issues for a while. She was seen by GI and a reason for her bloating was not determined. She still has her gallbladder in place.   She denies any sudden unexpected weight loss, change in bowel habits, change in urination habits, or new abodinal pain/distention.   She reports that she is UTD with her age-appropriate cancer screenings, including colonoscopy, pap smear, and mammogram and reports normal findings. She denies any fhx of cancer.  She reports that she has a scheduled follow-up appointment to monitor the small nodule in her right lung. Her primary has ordered a CT scan.   She reports that things have been stable  overall over the last month.   INTERVAL HISTORY: Rachel Mccarty is a wonderful 62 y.o. female who is connected via phone for continued evaluation and management of history of pulmonary embolism, pancytopenia. Patient was initially seen by me on 03/02/2023.   .I connected with Rachel Mccarty on 04/07/2023 at  3:30 PM EST by telephone visit and verified that I am speaking with the correct person using two identifiers.   Patient  notes she has been doing well overall since our last visit. She complains of bilateral leg swelling. She denies any new infection issues, fever, chills, night sweats, unexpected weight loss, back pain, chest pain.  I discussed the limitations, risks, security and privacy concerns of performing an evaluation and management service by telemedicine and the availability of in-person appointments. I also discussed with the patient that there may be a patient responsible charge related to this service. The patient expressed understanding and agreed to proceed.   Other persons participating in the visit and their role in the encounter: None   Patient's location: Home  Provider's location: [facility]   Chief Complaint: pulmonary embolism, pancytopenia    MEDICAL HISTORY:  Past Medical History:  Diagnosis Date   Acid reflux    Hyperlipidemia    Hypertension    Palpitations     SURGICAL HISTORY: Past Surgical History:  Procedure Laterality Date   COLONOSCOPY  2013   OVARIAN CYST SURGERY     UPPER GI ENDOSCOPY  2018    SOCIAL HISTORY: Social History   Socioeconomic History   Marital status: Legally Separated    Spouse name: Not on file   Number of children: 2   Years of education: Not on file   Highest education level: Not on file  Occupational History   Not on file  Tobacco Use   Smoking status: Never   Smokeless tobacco: Never  Vaping Use   Vaping status: Never Used  Substance and Sexual Activity   Alcohol use: Yes    Alcohol/week: 1.0 standard drink of alcohol    Types: 1 Cans of beer per week    Comment: 1 drink per day   Drug use: Never   Sexual activity: Not on file  Other Topics Concern   Not on file  Social History Narrative   Not on file   Social Drivers of Health   Financial Resource Strain: Low Risk  (07/06/2020)   Received from [facility] visits prior to 06/18/2022., [facility] visits prior to 06/18/2022.   Overall  Financial Resource Strain (CARDIA)    Difficulty of Paying Living Expenses: Not hard at all  Food Insecurity: No Food Insecurity (02/15/2023)   Hunger Vital Sign    Worried About Running Out of Food in the Last Year: Never true    Ran Out of Food in the Last Year: Never true  Transportation Needs: No Transportation Needs (02/15/2023)   PRAPARE - Administrator, Civil Service (Medical): No    Lack of Transportation (Non-Medical): No  Physical Activity: Insufficiently Active (07/06/2020)   Received from [facility] visits prior to 06/18/2022., [facility] visits prior to 06/18/2022.   Exercise Vital Sign    Days of Exercise per Week: 3 days    Minutes of Exercise per Session: 20 min  Stress: No Stress Concern Present (07/06/2020)   [facility] from [facility] visits prior to 06/18/2022., [facility]  visits prior to 06/18/2022.   [employer] of Occupational Health - Occupational Stress Questionnaire    Feeling of Stress : Only a little  Social Connections: Socially Isolated (07/06/2020)   Received from [facility] visits prior to 06/18/2022., [facility] visits prior to 06/18/2022.   Social Connection and Isolation Panel [NHANES]    Frequency of Communication with Friends and Family: More than three times a week    Frequency of Social Gatherings with Friends and Family: Three times a week    Attends Religious Services: Never    Active Member of Clubs or Organizations: No    Attends Banker Meetings: Never    Marital Status: Separated  Intimate Partner Violence: Not At Risk (02/15/2023)   Humiliation, Afraid, Rape, and Kick questionnaire    Fear of Current or Ex-Partner: No    Emotionally Abused: No    Physically Abused: No    Sexually Abused: No    FAMILY HISTORY: Family History  Problem Relation Age of Onset   Sudden Cardiac Death Mother     Dementia Mother    High blood pressure Father    Heart attack Maternal Grandmother    Stroke Maternal Grandfather    Colon cancer Neg Hx    Pancreatic cancer Neg Hx    Esophageal cancer Neg Hx    Liver disease Neg Hx    Stomach cancer Neg Hx     ALLERGIES:  has no known allergies.  MEDICATIONS:  Current Outpatient Medications  Medication Sig Dispense Refill   ALPRAZolam (XANAX) 0.25 MG tablet Take 0.25 mg by mouth as needed for anxiety.     apixaban (ELIQUIS) 5 MG TABS tablet Take 1 tablet (5 mg total) by mouth 2 (two) times daily. Save this prescription to fill later.  Take this once you've completed your eliquis starter pack. 60 tablet 1   atenolol (TENORMIN) 50 MG tablet TAKE 1 TABLET BY MOUTH EVERY DAY 90 tablet 3   fluticasone (FLONASE) 50 MCG/ACT nasal spray Place 2 sprays into both nostrils daily as needed for allergies.     Multiple Vitamin tablet Take 2 tablets by mouth at bedtime.     omeprazole (PRILOSEC) 40 MG capsule Take 40 mg by mouth daily.     No current facility-administered medications for this visit.    REVIEW OF SYSTEMS:    10 Point review of Systems was done is negative except as noted above.  PHYSICAL EXAMINATION: ECOG PERFORMANCE STATUS: 1 - Symptomatic but completely ambulatory  . There were no vitals filed for this visit.  There were no vitals filed for this visit.  .There is no height or weight on file to calculate BMI.  GENERAL:alert, in no acute distress and comfortable SKIN: no acute rashes, no significant lesions EYES: conjunctiva are pink and non-injected, sclera anicteric OROPHARYNX: MMM, no exudates, no oropharyngeal erythema or ulceration NECK: supple, no JVD LYMPH:  no palpable lymphadenopathy in the cervical, axillary or inguinal regions LUNGS: clear to auscultation b/l with normal respiratory effort HEART: regular rate & rhythm ABDOMEN:  normoactive bowel sounds , non tender, not distended. Extremity: no pedal edema PSYCH:  alert & oriented x 3 with fluent speech NEURO: no focal motor/sensory deficits  LABORATORY DATA:  I have reviewed the data as listed  .    Latest Ref Rng & Units 03/02/2023    1:31 PM 02/16/2023    5:27 AM 02/15/2023    5:25 AM  CBC  WBC  4.0 - 10.5 K/uL 3.6  3.5  3.7   Hemoglobin 12.0 - 15.0 g/dL 14.7  82.9  56.2   Hematocrit 36.0 - 46.0 % 42.7  39.7  37.2   Platelets 150 - 400 K/uL 168  107  113     .    Latest Ref Rng & Units 02/16/2023    5:27 AM 02/15/2023    5:25 AM 12/13/2021    2:13 PM  CMP  Glucose 70 - 99 mg/dL 85  130  90   BUN 8 - 23 mg/dL 10  13  12    Creatinine 0.44 - 1.00 mg/dL 8.65  7.84  6.96   Sodium 135 - 145 mmol/L 137  137  139   Potassium 3.5 - 5.1 mmol/L 3.9  4.0  3.9   Chloride 98 - 111 mmol/L 106  104  102   CO2 22 - 32 mmol/L 22  26  28    Calcium 8.9 - 10.3 mg/dL 9.1  9.7  9.9   Total Protein 6.5 - 8.1 g/dL  6.6  7.5   Total Bilirubin 0.3 - 1.2 mg/dL  0.7  0.8   Alkaline Phos 38 - 126 U/L  50  54   AST 15 - 41 U/L  17  21   ALT 0 - 44 U/L  15  23      RADIOGRAPHIC STUDIES: I have personally reviewed the radiological images as listed and agreed with the findings in the report.    No results found.  ASSESSMENT & PLAN:   61 y.o. female with:  History of pulmonary embolism Pancytopenia  PLAN: -Discussed lab results from 03/02/2023 in detail with the patient. CBC showed slightly low WBC of 3.6 K, but stable overall. Homocysteine level in the normal range at 15.2. Anticardiolipin IgG, IgM, and IgA levels are in the normal range. Prothrombin gene mutation not detected. Factor-5 leiden mutation not detected. Beta-2-glycoprotein IgA/M/G in the normal range. Protein S levels in the normal range. Protein-C levels in the normal range. Antithrombin III levels in the normal range. dRVVT confrom of 1.0.  -Discussed Multiple myeloma panel results from 03/02/2023. Showed elevated M-protein of 0.3. Immunofixation shows IgG monoclonal protein with kappa  light chain specificity.  -Discussed Lupus anticoagulant panel from 03/02/2023. Showed elevated DRVVT of 62, but no lupus anticoagulant was detected. -According to the labs, patient does not have genetic risk factors for blood clotting.  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 Recommend to re-check multiple myeloma panel in 6 months. -Discussed with the patient that we cannot say if the long car drive was the only risk factor for the blood clot. So the next plan would be continuing full-dose blood thinner. Pt agrees. -Discussed the option of getting referred to Vascular surgeon regarding venous reflux. Pt agrees.  -Will refer the patient to Vascular surgeon and prescribe full-dose blood thinner.  -proceed with follow up appointment to monitor small nodule in her right lung apex -continue to stay UTD with age-appropriate cancer screenings  . No orders of the defined types were placed in this encounter.   FOLLOW-UP: RTC with Dr Candise Che in 6 months Labs 7-10 days prior to clinic visit Referral to vascular surgery for evaluation and mx of significant chronic venous reflux.   The total time spent in the appointment was *** minutes* .  All of the patient's questions were answered with apparent satisfaction. The patient knows to call the clinic with any problems, questions or concerns.  Wyvonnia Lora MD MS AAHIVMS [facility] Hematology/Oncology Physician [facility]  .*Total Encounter Time as defined by the Centers for Medicare and Medicaid Services includes, in addition to the face-to-face time of a patient visit (documented in the note above) non-face-to-face time: obtaining and reviewing outside history, ordering and reviewing medications, tests or procedures, care coordination (communications with other health care professionals or caregivers) and documentation in the medical record.   I,Param Shah,acting as a Neurosurgeon for Wyvonnia Lora, MD.,have documented all  relevant documentation on the behalf of Wyvonnia Lora, MD,as directed by  Wyvonnia Lora, MD while in the presence of Wyvonnia Lora, MD.

## 2023-04-10 ENCOUNTER — Telehealth: Payer: Self-pay | Admitting: Hematology

## 2023-04-10 NOTE — Telephone Encounter (Signed)
Spoke with patient confirming upcoming appointment  

## 2023-04-27 ENCOUNTER — Emergency Department (HOSPITAL_COMMUNITY): Payer: 59

## 2023-04-27 ENCOUNTER — Emergency Department (HOSPITAL_COMMUNITY)
Admission: EM | Admit: 2023-04-27 | Discharge: 2023-04-27 | Disposition: A | Discharge status: Home / Self Care | Payer: 59 | Attending: Emergency Medicine | Admitting: Emergency Medicine

## 2023-04-27 ENCOUNTER — Other Ambulatory Visit: Payer: Self-pay

## 2023-04-27 DIAGNOSIS — R42 Dizziness and giddiness: Secondary | ICD-10-CM | POA: Insufficient documentation

## 2023-04-27 DIAGNOSIS — D72819 Decreased white blood cell count, unspecified: Secondary | ICD-10-CM | POA: Diagnosis not present

## 2023-04-27 DIAGNOSIS — R002 Palpitations: Secondary | ICD-10-CM | POA: Diagnosis not present

## 2023-04-27 DIAGNOSIS — Z79899 Other long term (current) drug therapy: Secondary | ICD-10-CM | POA: Diagnosis not present

## 2023-04-27 DIAGNOSIS — Z7901 Long term (current) use of anticoagulants: Secondary | ICD-10-CM | POA: Insufficient documentation

## 2023-04-27 DIAGNOSIS — R0602 Shortness of breath: Secondary | ICD-10-CM | POA: Diagnosis present

## 2023-04-27 DIAGNOSIS — R7989 Other specified abnormal findings of blood chemistry: Secondary | ICD-10-CM | POA: Insufficient documentation

## 2023-04-27 DIAGNOSIS — I10 Essential (primary) hypertension: Secondary | ICD-10-CM | POA: Diagnosis not present

## 2023-04-27 LAB — CBC WITH DIFFERENTIAL/PLATELET
Abs Immature Granulocytes: 0.01 10*3/uL (ref 0.00–0.07)
Basophils Absolute: 0 10*3/uL (ref 0.0–0.1)
Basophils Relative: 1 %
Eosinophils Absolute: 0.1 10*3/uL (ref 0.0–0.5)
Eosinophils Relative: 2 %
HCT: 42 % (ref 36.0–46.0)
Hemoglobin: 13.9 g/dL (ref 12.0–15.0)
Immature Granulocytes: 0 %
Lymphocytes Relative: 26 %
Lymphs Abs: 0.9 10*3/uL (ref 0.7–4.0)
MCH: 30.9 pg (ref 26.0–34.0)
MCHC: 33.1 g/dL (ref 30.0–36.0)
MCV: 93.3 fL (ref 80.0–100.0)
Monocytes Absolute: 0.3 10*3/uL (ref 0.1–1.0)
Monocytes Relative: 8 %
Neutro Abs: 2.1 10*3/uL (ref 1.7–7.7)
Neutrophils Relative %: 63 %
Platelets: 163 10*3/uL (ref 150–400)
RBC: 4.5 MIL/uL (ref 3.87–5.11)
RDW: 12 % (ref 11.5–15.5)
WBC: 3.3 10*3/uL — ABNORMAL LOW (ref 4.0–10.5)
nRBC: 0 % (ref 0.0–0.2)

## 2023-04-27 LAB — COMPREHENSIVE METABOLIC PANEL
ALT: 18 U/L (ref 0–44)
AST: 21 U/L (ref 15–41)
Albumin: 4 g/dL (ref 3.5–5.0)
Alkaline Phosphatase: 41 U/L (ref 38–126)
Anion gap: 9 (ref 5–15)
BUN: 15 mg/dL (ref 8–23)
CO2: 25 mmol/L (ref 22–32)
Calcium: 9.5 mg/dL (ref 8.9–10.3)
Chloride: 103 mmol/L (ref 98–111)
Creatinine, Ser: 1.19 mg/dL — ABNORMAL HIGH (ref 0.44–1.00)
GFR, Estimated: 51 mL/min — ABNORMAL LOW (ref >60)
Glucose, Bld: 102 mg/dL — ABNORMAL HIGH (ref 70–99)
Potassium: 3.9 mmol/L (ref 3.5–5.1)
Sodium: 137 mmol/L (ref 135–145)
Total Bilirubin: 0.9 mg/dL (ref 0.0–1.2)
Total Protein: 6.6 g/dL (ref 6.5–8.1)

## 2023-04-27 LAB — TROPONIN I (HIGH SENSITIVITY)
Troponin I (High Sensitivity): 3 ng/L (ref <18)
Troponin I (High Sensitivity): <2 ng/L (ref <18)

## 2023-04-27 LAB — D-DIMER, QUANTITATIVE: D-Dimer, Quant: <0.27 ug{FEU}/mL (ref 0.00–0.50)

## 2023-04-27 LAB — BRAIN NATRIURETIC PEPTIDE: B Natriuretic Peptide: 35.7 pg/mL (ref 0.0–100.0)

## 2023-04-27 LAB — TSH: TSH: 0.893 u[IU]/mL (ref 0.350–4.500)

## 2023-04-27 LAB — MAGNESIUM: Magnesium: 2.3 mg/dL (ref 1.7–2.4)

## 2023-04-27 MED ORDER — SODIUM CHLORIDE 0.9 % IV BOLUS
1000.0000 mL | Freq: Once | INTRAVENOUS | Status: AC
  Administered 2023-04-27: 1000 mL via INTRAVENOUS

## 2023-04-27 NOTE — Discharge Instructions (Addendum)
 Your evaluation today has been reassuring, we do not see evidence of recurrent blood clot or other cardiac or lung problems contributing to your symptoms.  Your kidney function was slightly elevated today and could suggest some dehydration.  I am reassured that your symptoms have improved after fluids today.  Please follow-up with your cardiologist as planned.  If you develop new or worsening symptoms return to the ED for further evaluation.

## 2023-04-27 NOTE — ED Provider Notes (Signed)
 [place] EMERGENCY DEPARTMENT AT [facility] Provider Note   CSN: [id number] Arrival date & time: 04/27/23  0630     History  Chief Complaint  Patient presents with   Palpitations/SOB     Rachel Mccarty is a 63 y.o. female.  Rachel Mccarty is a 63 y.o. female with hx of recent PE, hypertension, hyperlipidemia and GERD, who presents for evaluation of shortness of breath, lightheadedness and palpitations.  Symptoms have been occurring intermittently over the past week.  Patient reports she notices them more often with exertion.  She denies any associated chest pain.  She reports that she became concerned because the symptoms feel similar to when she was diagnosed with a PE at the end of October.  She has been on Eliquis  since this diagnosis and reports she has not missed any doses.  No recent travel or surgery.  No pain or swelling in her legs.  No recent fevers, she reports an occasional nonproductive cough.  No other aggravating or alleviating factors.  Patient is followed by Dr. Elmira with Cardiology.  The history is provided by the patient and medical records.       Home Medications Prior to Admission medications   Medication Sig Start Date End Date Taking? Authorizing Provider  ALPRAZolam  (XANAX ) 0.25 MG tablet Take 0.25 mg by mouth as needed for anxiety. 07/22/19   [provider]  apixaban  (ELIQUIS ) 5 MG TABS tablet Take 1 tablet (5 mg total) by mouth 2 (two) times daily. Save this prescription to fill later.  Take this once you've completed your eliquis  starter pack. 03/16/23   Perri DELENA Meliton Mickey., MD  atenolol  (TENORMIN ) 50 MG tablet TAKE 1 TABLET BY MOUTH EVERY DAY 10/07/22   Patwardhan, Newman PARAS, MD  fluticasone  (FLONASE ) 50 MCG/ACT nasal spray Place 2 sprays into both nostrils daily as needed for allergies. 05/03/19   [provider]  Multiple Vitamin tablet Take 2 tablets by mouth at bedtime.    [provider]  omeprazole  (PRILOSEC) 40 MG capsule Take 40 mg by mouth daily.    [provider]      Allergies    Patient has no known allergies.    Review of Systems   Review of Systems  Constitutional:  Negative for chills and fever.  Respiratory:  Positive for cough and shortness of breath.   Cardiovascular:  Positive for palpitations. Negative for chest pain.  Neurological:  Positive for light-headedness.    Physical Exam Updated Vital Signs BP (!) 141/92 (BP Location: Right Arm)   Pulse 77   Temp 97.9 F (36.6 C)   Resp 16   SpO2 100%  Physical Exam Vitals and nursing note reviewed.  Constitutional:      General: She is not in acute distress.    Appearance: Normal appearance. She is well-developed. She is not ill-appearing or diaphoretic.  HENT:     Head: Normocephalic and atraumatic.  Eyes:     General:        Right eye: No discharge.        Left eye: No discharge.     Pupils: Pupils are equal, round, and reactive to light.  Cardiovascular:     Rate and Rhythm: Normal rate and regular rhythm.     Pulses: Normal pulses.     Heart sounds: Normal heart sounds.  Pulmonary:     Effort: Pulmonary effort is normal. No respiratory distress.     Breath sounds: Normal breath sounds.  No wheezing or rales.     Comments: Respirations equal and unlabored, patient able to speak in full sentences, lungs clear to auscultation bilaterally  Abdominal:     General: Bowel sounds are normal. There is no distension.     Palpations: Abdomen is soft. There is no mass.     Tenderness: There is no abdominal tenderness. There is no guarding.     Comments: Abdomen soft, nondistended, nontender to palpation in all quadrants without guarding or peritoneal signs  Musculoskeletal:        General: No deformity.     Cervical back: Neck supple.     Right lower leg: No edema.     Left lower leg: No edema.  Skin:    General: Skin is warm and dry.     Capillary Refill: Capillary refill takes less than 2  seconds.  Neurological:     Mental Status: She is alert and oriented to person, place, and time.     Coordination: Coordination normal.     Comments: Speech is clear, able to follow commands CN III-XII intact Normal strength in upper and lower extremities bilaterally including dorsiflexion and plantar flexion, strong and equal grip strength Sensation normal to light and sharp touch Moves extremities without ataxia, coordination intact  Psychiatric:        Mood and Affect: Mood normal.        Behavior: Behavior normal.     ED Results / Procedures / Treatments   Labs (all labs ordered are listed, but only abnormal results are displayed) Labs Reviewed  CBC WITH DIFFERENTIAL/PLATELET - Abnormal; Notable for the following components:      Result Value   WBC 3.3 (*)    All other components within normal limits  COMPREHENSIVE METABOLIC PANEL - Abnormal; Notable for the following components:   Glucose, Bld 102 (*)    Creatinine, Ser 1.19 (*)    GFR, Estimated 51 (*)    All other components within normal limits  D-DIMER, QUANTITATIVE  BRAIN NATRIURETIC PEPTIDE  TSH  MAGNESIUM  TROPONIN I (HIGH SENSITIVITY)  TROPONIN I (HIGH SENSITIVITY)    EKG None  Radiology DG Chest 2 View Result Date: 04/27/2023 CLINICAL DATA:  Shortness of breath. EXAM: CHEST - 2 VIEW COMPARISON:  02/15/2023 FINDINGS: Lungs are hyperexpanded. The lungs are clear without focal pneumonia, edema, pneumothorax or pleural effusion. The cardiopericardial silhouette is within normal limits for size. No acute bony abnormality. IMPRESSION: No active cardiopulmonary disease. Electronically Signed   By: Camellia Candle M.D.   On: 04/27/2023 07:03    Procedures Procedures    Medications Ordered in ED Medications  sodium chloride  0.9 % bolus 1,000 mL (0 mLs Intravenous Stopped 04/27/23 1231)    ED Course/ Medical Decision Making/ A&P                                 Medical Decision Making Amount and/or Complexity  of Data Reviewed Labs: ordered. Radiology: ordered.   Patient presents to the emergency department with shortness of breath, palpitations and lightheadedness. Patient nontoxic appearing, in no apparent distress, vitals without significant abnormality. Fairly benign physical exam.   DDX including but not limited to: ACS, pulmonary embolism, dissection, pneumothorax, pneumonia, arrhythmia, severe anemia, dehydration, anxiety.  Additional history obtained:  Chart & nursing note reviewed.    EKG: Normal sinus rhythm without ischemic changes  Lab Tests:  I reviewed & interpreted labs including:  Mild leukopenia of 3.3, normal hemoglobin, no significant electrolyte derangements but creatinine of 1.19, slightly elevated from baseline, could be related to potential dehydration.  Troponins negative x 2, D-dimer < 0.27 (was elevated with prior PE), TSH, magnesium and BNP all within normal limits  Imaging Studies ordered:  I ordered and viewed the following imaging, agree with radiologist impression:  Chest x-ray with no active cardiopulmonary disease  ED Course:  I ordered medications including 1 L fluid bolus  RE-EVAL: Patient ambulated with pulse ox, no tachycardia or hypoxia and patient reports feeling much better after IV fluids without shortness of breath, palpitations or lightheadedness with ambulation.  EKG without obvious acute ischemia, delta troponin negative, doubt ACS.  D-dimer is negative and patient without tachycardia, tachypnea or hypoxia, doubt recurrence of pulmonary embolism. Pain is not a tearing sensation, symmetric pulses, no widening of mediastinum on CXR, doubt dissection. Cardiac monitor reviewed, no notable arrhythmias or tachycardia  Based on patient's chief complaint, I considered admission might be necessary, however after reassuring ED workup feel patient is reasonable for discharge.   I discussed results, treatment plan, need for PCP follow-up, and return  precautions with the patient. Provided opportunity for questions, patient confirmed understanding and is in agreement with plan.   Portions of this note were generated with Scientist, clinical (histocompatibility and immunogenetics). Dictation errors may occur despite best attempts at proofreading.         Final Clinical Impression(s) / ED Diagnoses Final diagnoses:  Shortness of breath  Palpitations  Lightheadedness    Rx / DC Orders ED Discharge Orders     None         Alva Larraine FALCON, PA-C 04/28/23 1615    Elnor Hila P, DO 05/11/23 0720

## 2023-04-27 NOTE — ED Notes (Signed)
 Pt O2 was 97 while ambulating. Pt ambulated w/o assistance. Pt states that she had no pain while ambulating.

## 2023-04-27 NOTE — ED Triage Notes (Signed)
 Patient reports SOB with palpitations and  lightheadedness this week , she stated history of PE , denies cough or fever .

## 2023-05-01 ENCOUNTER — Other Ambulatory Visit: Payer: Self-pay

## 2023-05-01 DIAGNOSIS — D472 Monoclonal gammopathy: Secondary | ICD-10-CM

## 2023-05-01 DIAGNOSIS — D696 Thrombocytopenia, unspecified: Secondary | ICD-10-CM

## 2023-05-01 DIAGNOSIS — I2694 Multiple subsegmental pulmonary emboli without acute cor pulmonale: Secondary | ICD-10-CM

## 2023-05-03 ENCOUNTER — Telehealth: Payer: Self-pay | Admitting: Cardiology

## 2023-05-03 DIAGNOSIS — R002 Palpitations: Secondary | ICD-10-CM

## 2023-05-03 NOTE — Telephone Encounter (Signed)
 Pt states the palpitations are intermittent and she has SOB with these episodes along with lightheadedness. Pt is currently on Atenolol  for rapid HR. Pt states she went to PCP yesterday 1/14 and HR was 65 and she doesn't remember BP but her PCP stated it was good. Pt states that sometimes these episodes will just occur but usually it is when she is walking or bending over to tie her shoes or do a task. Advised pt on ED precautions. Explained to pt I would forward this information to Dr. Filiberto Hug for recommendations.

## 2023-05-03 NOTE — Telephone Encounter (Signed)
 Patient c/o Palpitations:  High priority if patient c/o lightheadedness, shortness of breath, or chest pain  How long have you had palpitations/irregular HR/ Afib? Are you having the symptoms now? About 2 weeks   Are you currently experiencing lightheadedness, SOB or CP? SOB   Do you have a history of afib (atrial fibrillation) or irregular heart rhythm? No  Have you checked your BP or HR? (document readings if available): Yes, pt stated it's fine  Are you experiencing any other symptoms? Lightheadedness, SOB and dizziness

## 2023-05-04 ENCOUNTER — Ambulatory Visit: Payer: 59 | Attending: Cardiology

## 2023-05-04 ENCOUNTER — Encounter: Payer: Self-pay | Admitting: *Deleted

## 2023-05-04 ENCOUNTER — Telehealth: Payer: Self-pay | Admitting: *Deleted

## 2023-05-04 DIAGNOSIS — R002 Palpitations: Secondary | ICD-10-CM

## 2023-05-04 NOTE — Telephone Encounter (Signed)
-----   Message from Shiloh W sent at 05/04/2023  5:58 PM EST ----- Regarding: RE: 7 day zio per Dr. Rosemary Holms done ----- Message ----- From: Loa Socks, LPN Sent: 07/18/270   2:21 PM EST To: Ernst Bowler; Katrina Claria Dice Subject: 7 day zio per Dr. Rosemary Holms                   Dr. Rosemary Holms ordered a 7 day zio for palpitations  Please enroll and let me know when you do/   Thanks, Lajoyce Corners

## 2023-05-04 NOTE — Addendum Note (Signed)
Addended by: Loa Socks on: 05/04/2023 02:22 PM   Modules accepted: Orders

## 2023-05-04 NOTE — Telephone Encounter (Signed)
Spoke with the pt and endorsed to her recommendations per Dr. Rosemary Holms, as indicated in this message.  Pt was having episodes quite frequently, and went in to see her PCP for this, a couple days ago.  Pt states her PCP started her on shorting acting diltiazem, and palpitations have improved, but still present.   Pt is aware that we will order for her to get a 7 day zio for palpitations.   She is aware that we will enroll and mail this to her home address.   Pt would like to keep her OV appt with Dr. Rosemary Holms as is on 05/18/23, and is aware that the zio results will more than likely not be back by that visit.  Pt is completely ok with this.  Pt verbalized understanding and agrees with this plan.

## 2023-05-04 NOTE — Progress Notes (Unsigned)
Enrolled for [employer] to mail a ZIO XT long term holter monitor to the patients address on file.  

## 2023-05-04 NOTE — Telephone Encounter (Signed)
Left the pt a message to call the office back to endorse recommendations per Dr. Rosemary Holms.

## 2023-05-04 NOTE — Telephone Encounter (Signed)
Pts zio monitor enrolled by our monitor tech and will be mailed to the pt accordingly.

## 2023-05-04 NOTE — Telephone Encounter (Signed)
If symptoms are frequent (daily or multiple times in a week), could use 1 week Zio monitor, with the hopes that it would result back before her appt with me on 1/30. If you don;t think it would result so soon, consider rescheduling appt with me a week or so later.  Thanks MJP

## 2023-05-10 DIAGNOSIS — R002 Palpitations: Secondary | ICD-10-CM

## 2023-05-13 ENCOUNTER — Emergency Department (HOSPITAL_BASED_OUTPATIENT_CLINIC_OR_DEPARTMENT_OTHER): Payer: 59 | Admitting: Radiology

## 2023-05-13 ENCOUNTER — Other Ambulatory Visit: Payer: Self-pay

## 2023-05-13 ENCOUNTER — Emergency Department (HOSPITAL_BASED_OUTPATIENT_CLINIC_OR_DEPARTMENT_OTHER)
Admission: EM | Admit: 2023-05-13 | Discharge: 2023-05-13 | Disposition: A | Discharge status: Home / Self Care | Payer: 59 | Attending: Emergency Medicine | Admitting: Emergency Medicine

## 2023-05-13 ENCOUNTER — Encounter (HOSPITAL_BASED_OUTPATIENT_CLINIC_OR_DEPARTMENT_OTHER): Payer: Self-pay | Admitting: Emergency Medicine

## 2023-05-13 DIAGNOSIS — R002 Palpitations: Secondary | ICD-10-CM | POA: Diagnosis present

## 2023-05-13 DIAGNOSIS — Z7901 Long term (current) use of anticoagulants: Secondary | ICD-10-CM | POA: Insufficient documentation

## 2023-05-13 DIAGNOSIS — I493 Ventricular premature depolarization: Secondary | ICD-10-CM | POA: Insufficient documentation

## 2023-05-13 LAB — BASIC METABOLIC PANEL
Anion gap: 8 (ref 5–15)
BUN: 15 mg/dL (ref 8–23)
CO2: 27 mmol/L (ref 22–32)
Calcium: 9.6 mg/dL (ref 8.9–10.3)
Chloride: 100 mmol/L (ref 98–111)
Creatinine, Ser: 1.18 mg/dL — ABNORMAL HIGH (ref 0.44–1.00)
GFR, Estimated: 52 mL/min — ABNORMAL LOW (ref >60)
Glucose, Bld: 95 mg/dL (ref 70–99)
Potassium: 3.6 mmol/L (ref 3.5–5.1)
Sodium: 135 mmol/L (ref 135–145)

## 2023-05-13 LAB — CBC WITH DIFFERENTIAL/PLATELET
Abs Immature Granulocytes: 0.01 10*3/uL (ref 0.00–0.07)
Basophils Absolute: 0 10*3/uL (ref 0.0–0.1)
Basophils Relative: 1 %
Eosinophils Absolute: 0.1 10*3/uL (ref 0.0–0.5)
Eosinophils Relative: 1 %
HCT: 40.8 % (ref 36.0–46.0)
Hemoglobin: 13.8 g/dL (ref 12.0–15.0)
Immature Granulocytes: 0 %
Lymphocytes Relative: 28 %
Lymphs Abs: 1.4 10*3/uL (ref 0.7–4.0)
MCH: 30.9 pg (ref 26.0–34.0)
MCHC: 33.8 g/dL (ref 30.0–36.0)
MCV: 91.3 fL (ref 80.0–100.0)
Monocytes Absolute: 0.4 10*3/uL (ref 0.1–1.0)
Monocytes Relative: 7 %
Neutro Abs: 3.1 10*3/uL (ref 1.7–7.7)
Neutrophils Relative %: 63 %
Platelets: 148 10*3/uL — ABNORMAL LOW (ref 150–400)
RBC: 4.47 MIL/uL (ref 3.87–5.11)
RDW: 12 % (ref 11.5–15.5)
WBC: 4.9 10*3/uL (ref 4.0–10.5)
nRBC: 0 % (ref 0.0–0.2)

## 2023-05-13 LAB — TROPONIN I (HIGH SENSITIVITY)
Troponin I (High Sensitivity): 3 ng/L (ref <18)
Troponin I (High Sensitivity): 3 ng/L (ref <18)

## 2023-05-13 LAB — MAGNESIUM: Magnesium: 1.8 mg/dL (ref 1.7–2.4)

## 2023-05-13 NOTE — ED Notes (Signed)
Pt states she feels a flutter in her chest when she stands up and down and bends over. Pt stood at bedside and bent down to touch her toes, unifocal PVCs noted on the cardiac monitor during. MD at bedside during.

## 2023-05-13 NOTE — ED Triage Notes (Signed)
  Patient comes in with chest pain and SOB that has been going on for a couple days.  Patient has hx of bilateral PEs and is wearing a cardiac monitor for palpitations she has been having the past few weeks.  Patient states the pain is exertional and gets worse when she bends over.  No pain at this time.  Denies N/V, or diaphoresis.

## 2023-05-13 NOTE — ED Provider Notes (Signed)
[place] EMERGENCY DEPARTMENT AT [facility]  Provider Note  CSN: [id number] Arrival date & time: 05/13/23 0001  History Chief Complaint  Patient presents with   Palpitations    Rachel Mccarty is a 63 y.o. female diagnosed with PE in Oct 2024 has been compliant with Eliquis since then here for palpitations, off and on for several weeks but more persistent today. She is followed by Dr. Joaquim Nam, Cardiology, and was sent a ZIO patch a few days ago. She reports some chest discomfort but no pain. She has felt mildly short of breath since her PE diagnosis, but no change today. She has been on atenolol for racing heart and her doctor also recently added diltiazem.    Home Medications Prior to Admission medications   Medication Sig Start Date End Date Taking? Authorizing Provider  ALPRAZolam Prudy Feeler) 0.25 MG tablet Take 0.25 mg by mouth as needed for anxiety. 07/22/19   [provider]  apixaban (ELIQUIS) 5 MG TABS tablet Take 1 tablet (5 mg total) by mouth 2 (two) times daily. Save this prescription to fill later.  Take this once you've completed your eliquis starter pack. 03/16/23   Zigmund Daniel., MD  atenolol (TENORMIN) 50 MG tablet TAKE 1 TABLET BY MOUTH EVERY DAY 10/07/22   Patwardhan, Manish J, MD  fluticasone (FLONASE) 50 MCG/ACT nasal spray Place 2 sprays into both nostrils daily as needed for allergies. 05/03/19   [provider]  Multiple Vitamin tablet Take 2 tablets by mouth at bedtime.    [provider]  omeprazole (PRILOSEC) 40 MG capsule Take 40 mg by mouth daily.    [provider]     Allergies    Cephalexin   Review of Systems   Review of Systems Please see HPI for pertinent positives and negatives  Physical Exam BP (!) 138/94   Pulse 85   Temp 98.2 F (36.8 C) (Oral)   Resp 19   Ht 5\' 5"  (1.651 m)   Wt 72.6 kg   SpO2 98%   BMI 26.63 kg/m   Physical Exam Vitals and nursing note reviewed.   Constitutional:      Appearance: Normal appearance.  HENT:     Head: Normocephalic and atraumatic.     Nose: Nose normal.     Mouth/Throat:     Mouth: Mucous membranes are moist.  Eyes:     Extraocular Movements: Extraocular movements intact.     Conjunctiva/sclera: Conjunctivae normal.  Cardiovascular:     Rate and Rhythm: Normal rate.  Pulmonary:     Effort: Pulmonary effort is normal.     Breath sounds: Normal breath sounds.  Abdominal:     General: Abdomen is flat.     Palpations: Abdomen is soft.     Tenderness: There is no abdominal tenderness.  Musculoskeletal:        General: No swelling. Normal range of motion.     Cervical back: Neck supple.  Skin:    General: Skin is warm and dry.  Neurological:     General: No focal deficit present.     Mental Status: She is alert.  Psychiatric:        Mood and Affect: Mood normal.     ED Results / Procedures / Treatments   EKG None  Procedures Procedures  Medications Ordered in the ED Medications - No data to display  Initial Impression and Plan  Patient here with intermittent palpitations, worse in recent days, symptomatically improved on  arrival to ED, but reports symptoms correlated with occasional PVC on monitor. No concern for recurrent PE, she had outpatient CTA 10 days ago which was normal. Will check labs, continue to monitor in the ED.   ED Course   Clinical Course as of 05/13/23 0311  Sat May 13, 2023  0040 CBC is normal.  [CS]  0121 BMP, Trop and Magnesium all unremarkable. Will continue to monitor for delta trop.  [CS]  0121 I personally viewed the images from radiology studies and agree with radiologist interpretation: CXR is clear.  [CS]  0309 Repeat trop remains normal. No PVCs or other dysrhythmias seen on monitor while supine. Recommend she follow up with cardiologist for further management and interpretation of her ZIO patch. RTED for any other concerns.  [CS]    Clinical Course User Index [CS]  Pollyann Savoy, MD     MDM Rules/Calculators/A&P Medical Decision Making Given presenting complaint, I considered that admission might be necessary. After review of results from ED lab and/or imaging studies, admission to the hospital is not indicated at this time.    Problems Addressed: PVC's (premature ventricular contractions): acute illness or injury  Amount and/or Complexity of Data Reviewed Labs: ordered. Decision-making details documented in ED Course. Radiology: ordered and independent interpretation performed. Decision-making details documented in ED Course. ECG/medicine tests: ordered and independent interpretation performed. Decision-making details documented in ED Course.  Risk Decision regarding hospitalization.     Final Clinical Impression(s) / ED Diagnoses Final diagnoses:  PVC's (premature ventricular contractions)    Rx / DC Orders ED Discharge Orders     None        Pollyann Savoy, MD 05/13/23 [phone]

## 2023-05-18 ENCOUNTER — Ambulatory Visit: Payer: 59 | Attending: Cardiology | Admitting: Cardiology

## 2023-05-18 ENCOUNTER — Encounter: Payer: Self-pay | Admitting: Cardiology

## 2023-05-18 VITALS — BP 142/90 | HR 76 | Resp 16 | Ht 65.0 in | Wt 163.6 lb

## 2023-05-18 DIAGNOSIS — I4729 Other ventricular tachycardia: Secondary | ICD-10-CM | POA: Diagnosis not present

## 2023-05-18 DIAGNOSIS — R002 Palpitations: Secondary | ICD-10-CM | POA: Diagnosis not present

## 2023-05-18 DIAGNOSIS — I728 Aneurysm of other specified arteries: Secondary | ICD-10-CM | POA: Diagnosis not present

## 2023-05-18 MED ORDER — VERAPAMIL HCL ER 120 MG PO TBCR: 120.0000 mg | EXTENDED_RELEASE_TABLET | Freq: Every day | ORAL | 1 refills | Status: DC

## 2023-05-18 NOTE — Progress Notes (Signed)
Cardiology Office Note:  .   Date:  05/18/2023  ID:  MISHEEL GOWANS, DOB 06/24/1960, MRN [id number] PCP: Richmond Campbell PA-C  [facility] Providers Cardiologist:  Truett Mainland, MD PCP: Richmond Campbell., PA-C  Chief Complaint  Patient presents with   Palpitations      History of Present Illness: .    SAMHITHA ROSEN is a 63 y.o. 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 Echocardiogram looked unremarkable. She was started on on Eliquis, likely lifelong due to unprovoked nature. She was also seen by hematology Dr. Candise Che. 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  Recently. She has had worsening palpitations, waking her up from sleep, lasting for several minutes to hours. She has associated exertional dyspnea, denies chest pain. She did not tolerate diltiazem in the past, is comfortable taking atenolol 50 mg daily.  Vitals:   05/18/23 1621  BP: (!) 142/90  Pulse: 76  Resp: 16  SpO2: 98%     ROS:  Review of Systems  Cardiovascular:  Positive for palpitations. Negative for chest pain, dyspnea on exertion, leg swelling and syncope.     Studies Reviewed: Marland Kitchen         Independently interpreted 1/25//2025: Hb 13.8 Cr 1.18, eGFR 52 Trop HS 3, 3 TSH 0.89  2023: Chol 228, TG 81, HDL 57, LDL 153   Echocardiogram 02/15/2023: 1. Left ventricular ejection fraction, by estimation, is 55 to 60%. Left  ventricular ejection fraction by 2D MOD biplane is 58.1 %. The left  ventricle has normal function. The left ventricle has no regional wall  motion abnormalities. Left ventricular  diastolic parameters are consistent with Grade I diastolic dysfunction  (impaired relaxation).   2. Right ventricular systolic function is normal. The right ventricular  size is normal. Tricuspid  regurgitation signal is inadequate for assessing  PA pressure.   3. The mitral valve is grossly normal. Trivial mitral valve  regurgitation. No evidence of mitral stenosis.   4. The aortic valve is tricuspid. Aortic valve regurgitation is mild. No  aortic stenosis is present.   5. There is mild dilatation of the ascending aorta, measuring 40 mm.   6. The inferior vena cava is normal in size with greater than 50%  respiratory variability, suggesting right atrial pressure of 3 mmHg.    Physical Exam:   Physical Exam Vitals and nursing note reviewed.  Constitutional:      General: She is not in acute distress. Neck:     Vascular: No JVD.  Cardiovascular:     Rate and Rhythm: Normal rate and regular rhythm.     Heart sounds: Normal heart sounds. No murmur heard. Pulmonary:     Effort: Pulmonary effort is normal.     Breath sounds: Normal breath sounds. No wheezing or rales.  Musculoskeletal:     Right lower leg: No edema.     Left lower leg: No edema.      VISIT DIAGNOSES:   ICD-10-CM   1. Palpitations  R00.2     2. NSVT (nonsustained ventricular tachycardia) (HCC)  I47.29        ASSESSMENT AND PLAN: .    DHANI IMEL is a 63 y.o. female with hypertension, hyperlipidemia, mild AI, NSVT, h/o unprovoked PE (01/2023)   NSVT/palpitations: Rcecent increase in symptoms, with associated dyspnea.  Current;y wearing Zio patch monitor. Conitnue atenolol 50 mg daily Added Verapamil SR 120 mg daily. Await Zio report before further changes.     Hypertension: Controlled.  Mixed hyperlipidemia: Uncontrolled. Recommend diet and lifestyle modification. Calcium score 0.  She wants to hold off statin.  Splenic artery aneurysm: Incidental finding. Will refer to vascular surgery at next visit.   Meds ordered this encounter  Medications   verapamil (CALAN-SR) 120 MG CR tablet    Sig: Take 1 tablet (120 mg total) by mouth daily.    Dispense:  90 tablet    Refill:  1      F/u in 4-8 weeks  Signed, Elder Negus, MD

## 2023-05-18 NOTE — Patient Instructions (Signed)
Medication Instructions:   START TAKING VERAPAMIL ER 120 MG BY MOUTH DAILY  *If you need a refill on your cardiac medications before your next appointment, please call your pharmacy*     Follow-Up:  4-8 WEEKS WITH DR. PATWARDHAN OR AN EXTENDER AT ANY HEARTCARE OFFICE

## 2023-07-12 ENCOUNTER — Ambulatory Visit: Payer: 59 | Admitting: Cardiology

## 2023-07-12 ENCOUNTER — Other Ambulatory Visit (HOSPITAL_COMMUNITY)

## 2023-07-12 ENCOUNTER — Other Ambulatory Visit (HOSPITAL_COMMUNITY): Payer: Self-pay | Admitting: Pulmonary Disease

## 2023-07-12 DIAGNOSIS — R0609 Other forms of dyspnea: Secondary | ICD-10-CM

## 2023-07-14 ENCOUNTER — Other Ambulatory Visit: Payer: Self-pay

## 2023-07-14 DIAGNOSIS — I872 Venous insufficiency (chronic) (peripheral): Secondary | ICD-10-CM

## 2023-07-18 ENCOUNTER — Ambulatory Visit (HOSPITAL_COMMUNITY): Attending: Pulmonary Disease

## 2023-07-18 DIAGNOSIS — R0609 Other forms of dyspnea: Secondary | ICD-10-CM | POA: Diagnosis present

## 2023-07-18 LAB — ECHOCARDIOGRAM COMPLETE
Area-P 1/2: 3.37 cm2
MV M vel: 5.05 m/s
MV Peak grad: 102 mmHg
P 1/2 time: 414 ms
S' Lateral: 3 cm

## 2023-07-26 ENCOUNTER — Ambulatory Visit (HOSPITAL_COMMUNITY)
Admission: RE | Admit: 2023-07-26 | Discharge: 2023-07-26 | Disposition: A | Discharge status: Home / Self Care | Payer: 59 | Source: Ambulatory Visit | Attending: Vascular Surgery | Admitting: Vascular Surgery

## 2023-07-26 ENCOUNTER — Encounter: Payer: Self-pay | Admitting: Physician Assistant

## 2023-07-26 ENCOUNTER — Ambulatory Visit: Payer: 59 | Admitting: Physician Assistant

## 2023-07-26 VITALS — BP 149/91 | Temp 98.0°F | Ht 65.0 in | Wt 169.3 lb

## 2023-07-26 DIAGNOSIS — I872 Venous insufficiency (chronic) (peripheral): Secondary | ICD-10-CM

## 2023-07-26 NOTE — Progress Notes (Signed)
 Requested by:  Frankie Israel, MD [address] [place],  [place] [address]  Reason for consultation: venous insufficiency    History of Present Illness   Rachel Mccarty is a 63 y.o. (1960-09-20) female who presents for repeat evaluation of venous insufficiency.  She has previously been evaluated by our office for bilateral ankle swelling in the past and spider veins.   She returns today for repeat evaluation.  She recently developed a pulmonary embolus and has been placed on anticoagulation.  She has no recent history of DVT.  She states her bilateral ankle swelling has been stable for several years now.  She continues to wear her compression stockings when working from home, which does control her symptoms.  Her leg swelling occasionally causes a heavy sensation around her ankles, however this is not extremely bothersome to her. She has developed more spider veins over the years which are sometimes itchy.  Past Medical History:  Diagnosis Date   Acid reflux    Hyperlipidemia    Hypertension    Palpitations     Past Surgical History:  Procedure Laterality Date   COLONOSCOPY  2013   OVARIAN CYST SURGERY     UPPER GI ENDOSCOPY  2018    Social History   Socioeconomic History   Marital status: Legally Separated    Spouse name: Not on file   Number of children: 2   Years of education: Not on file   Highest education level: Not on file  Occupational History   Not on file  Tobacco Use   Smoking status: Never   Smokeless tobacco: Never  Vaping Use   Vaping status: Never Used  Substance and Sexual Activity   Alcohol use: Yes    Alcohol/week: 1.0 standard drink of alcohol    Types: 1 Cans of beer per week    Comment: 1 drink per day   Drug use: Never   Sexual activity: Not on file  Other Topics Concern   Not on file  Social History Narrative   Not on file   Social Drivers of Health   Financial Resource Strain: Low Risk  (07/06/2020)   Received from  [facility] visits prior to 06/18/2022., [facility] visits prior to 06/18/2022.   Overall Financial Resource Strain (CARDIA)    Difficulty of Paying Living Expenses: Not hard at all  Food Insecurity: No Food Insecurity (02/15/2023)   Hunger Vital Sign    Worried About Running Out of Food in the Last Year: Never true    Ran Out of Food in the Last Year: Never true  Transportation Needs: No Transportation Needs (02/15/2023)   PRAPARE - Administrator, Civil Service (Medical): No    Lack of Transportation (Non-Medical): No  Physical Activity: Insufficiently Active (07/06/2020)   Received from [facility] visits prior to 06/18/2022., [facility] visits prior to 06/18/2022.   Exercise Vital Sign    Days of Exercise per Week: 3 days    Minutes of Exercise per Session: 20 min  Stress: No Stress Concern Present (07/06/2020)   [facility] prior to 06/18/2022., [facility] visits prior to 06/18/2022.   [employer] of Occupational Health - Occupational Stress Questionnaire    Feeling of Stress : Only a little  Social Connections: Socially Isolated (07/06/2020)   [facility]  prior to 06/18/2022., [facility] visits prior to 06/18/2022.   Social Connection and Isolation Panel [NHANES]    Frequency of Communication with Friends and Family: More than three times a week    Frequency of Social Gatherings with Friends and Family: Three times a week    Attends Religious Services: Never    Active Member of Clubs or Organizations: No    Attends Banker Meetings: Never    Marital Status: Separated  Intimate Partner Violence: Not At Risk (02/15/2023)   Humiliation, Afraid, Rape, and Kick questionnaire    Fear of Current or Ex-Partner: No    Emotionally Abused: No    Physically  Abused: No    Sexually Abused: No    Family History  Problem Relation Age of Onset   Sudden Cardiac Death Mother    Dementia Mother    High blood pressure Father    Heart attack Maternal Grandmother    Stroke Maternal Grandfather    Colon cancer Neg Hx    Pancreatic cancer Neg Hx    Esophageal cancer Neg Hx    Liver disease Neg Hx    Stomach cancer Neg Hx     Current Outpatient Medications  Medication Sig Dispense Refill   ALPRAZolam (XANAX) 0.25 MG tablet Take 0.25 mg by mouth as needed for anxiety.     apixaban (ELIQUIS) 5 MG TABS tablet Take 1 tablet (5 mg total) by mouth 2 (two) times daily. Save this prescription to fill later.  Take this once you've completed your eliquis starter pack. 60 tablet 1   atenolol (TENORMIN) 50 MG tablet TAKE 1 TABLET BY MOUTH EVERY DAY 90 tablet 3   fluticasone (FLONASE) 50 MCG/ACT nasal spray Place 2 sprays into both nostrils daily as needed for allergies.     Multiple Vitamin tablet Take 2 tablets by mouth at bedtime.     omeprazole (PRILOSEC) 40 MG capsule Take 40 mg by mouth daily.     verapamil (CALAN-SR) 120 MG CR tablet Take 1 tablet (120 mg total) by mouth daily. 90 tablet 1   No current facility-administered medications for this visit.    Allergies  Allergen Reactions   Cephalexin Diarrhea    REVIEW OF SYSTEMS (negative unless checked):   Cardiac:  []  Chest pain or chest pressure? []  Shortness of breath upon activity? []  Shortness of breath when lying flat? []  Irregular heart rhythm?  Vascular:  []  Pain in calf, thigh, or hip brought on by walking? []  Pain in feet at night that wakes you up from your sleep? []  Blood clot in your veins? [x]  Leg swelling?  Pulmonary:  []  Oxygen at home? []  Productive cough? []  Wheezing?  Neurologic:  []  Sudden weakness in arms or legs? []  Sudden numbness in arms or legs? []  Sudden onset of difficult speaking or slurred speech? []  Temporary loss of vision in one eye? []  Problems with  dizziness?  Gastrointestinal:  []  Blood in stool? []  Vomited blood?  Genitourinary:  []  Burning when urinating? []  Blood in urine?  Psychiatric:  []  Major depression  Hematologic:  []  Bleeding problems? []  Problems with blood clotting?  Dermatologic:  []  Rashes or ulcers?  Constitutional:  []  Fever or chills?  Ear/Nose/Throat:  []  Change in hearing? []  Nose bleeds? []  Sore throat?  Musculoskeletal:  []  Back pain? []  Joint pain? []  Muscle pain?   Physical Examination     Vitals:   07/26/23 1330  BP: (!) 149/91  Temp: 98  F (36.7 C)  TempSrc: Temporal  SpO2: 96%  Weight: 169 lb 4.8 oz (76.8 kg)  Height: 5\' 5"  (1.651 m)   Body mass index is 28.17 kg/m.  General:  WDWN in NAD; vital signs documented above Gait: Not observed HENT: WNL, normocephalic Pulmonary: normal non-labored breathing , without Rales, rhonchi,  wheezing Cardiac: regular Abdomen: soft, NT, no masses Skin: without rashes Vascular Exam/Pulses: palpable pedal pulses Extremities: without varicose veins, with reticular veins, with ankle edema, without stasis pigmentation, without lipodermatosclerosis, without ulcers Musculoskeletal: no muscle wasting or atrophy  Neurologic: A&O X 3;  No focal weakness or paresthesias are detected Psychiatric:  The pt has Normal affect.  Non-invasive Vascular Imaging   LLE Venous Insufficiency Duplex (07/26/2023):  LEFT          Reflux NoRefluxReflux TimeDiameter cmsComments                               Yes                                        +--------------+---------+------+-----------+------------+-------------+  CFV          no                                                   +--------------+---------+------+-----------+------------+-------------+  FV mid        no                                                   +--------------+---------+------+-----------+------------+-------------+  Popliteal    no                                                    +--------------+---------+------+-----------+------------+-------------+  GSV at SFJ              yes    >500 ms      .78                    +--------------+---------+------+-----------+------------+-------------+  GSV prox thighno                            .36                    +--------------+---------+------+-----------+------------+-------------+  GSV mid thigh no                            .30                    +--------------+---------+------+-----------+------------+-------------+  GSV dist thighno                            .28                    +--------------+---------+------+-----------+------------+-------------+  GSV at knee   no                            .25                    +--------------+---------+------+-----------+------------+-------------+  GSV prox calf           yes    >500 ms      .28     out of fascia  +--------------+---------+------+-----------+------------+-------------+  SSV Pop Fossa no                            .24                    +--------------+---------+------+-----------+------------+-------------+  SSV prox calf no                            .22                    +--------------+---------+------+-----------+------------+-------------+    Medical Decision Making   Rachel Mccarty is a 63 y.o. female who presents for evaluation of venous insufficiency  Based on the patient's duplex, there is reflux in the left greater saphenous vein at the saphenofemoral junction in the proximal calf.  The remainder of the patient's deep and superficial venous system is competent.  There is no evidence of DVT or SVT on exam.  She would not be a candidate for ablation therapy given that her GSV is competent throughout most of the thigh. We have previously evaluated the patient for history of venous insufficiency and lower extremity swelling.  Her leg swelling has been  present for over a decade.  It is most prominent around her ankles. Her symptoms have been well-controlled for years now by wearing compression stockings during the day and elevating her legs as needed.  Her biggest complaint is the itchiness she gets from her spider veins. On exam she has mild ankle edema bilaterally.  She has numerous spider and reticular veins scattered along both of her legs.  She has palpable pedal pulses. I have encouraged the patient to continue conservative therapy for treatment of her venous insufficiency.  This seems to be controlling her symptoms well.  I have offered her sclerotherapy treatment for her spider veins, however she does not want treatment at this time. She can follow-up with our office as needed.  She can call our office in the future if she would like to see Haskell Linker for sclerotherapy treatment.   Jaymison Luber Lord Rod, PA-C [facility] Office: [phone]  07/26/2023, 10:10 AM  Clinic MD: Vikki Graves

## 2023-08-28 ENCOUNTER — Other Ambulatory Visit: Payer: 59

## 2023-09-15 ENCOUNTER — Ambulatory Visit: Admitting: Cardiology

## 2023-10-05 ENCOUNTER — Other Ambulatory Visit: Payer: Self-pay

## 2023-10-05 ENCOUNTER — Inpatient Hospital Stay: Payer: 59

## 2023-10-05 DIAGNOSIS — D696 Thrombocytopenia, unspecified: Secondary | ICD-10-CM

## 2023-10-05 DIAGNOSIS — D472 Monoclonal gammopathy: Secondary | ICD-10-CM

## 2023-10-11 ENCOUNTER — Ambulatory Visit: Payer: 59 | Admitting: Hematology

## 2023-12-01 ENCOUNTER — Other Ambulatory Visit: Payer: Self-pay | Admitting: Cardiology

## 2023-12-01 DIAGNOSIS — I10 Essential (primary) hypertension: Secondary | ICD-10-CM

## 2023-12-01 DIAGNOSIS — I4729 Other ventricular tachycardia: Secondary | ICD-10-CM

## 2024-02-22 ENCOUNTER — Encounter (HOSPITAL_COMMUNITY): Payer: 59

## 2024-02-22 ENCOUNTER — Ambulatory Visit (HOSPITAL_COMMUNITY)
Admission: RE | Admit: 2024-02-22 | Discharge: 2024-02-22 | Disposition: A | Discharge status: Home / Self Care | Payer: 59 | Source: Ambulatory Visit | Attending: Cardiovascular Disease | Admitting: Cardiovascular Disease

## 2024-02-22 DIAGNOSIS — I340 Nonrheumatic mitral (valve) insufficiency: Secondary | ICD-10-CM | POA: Insufficient documentation

## 2024-02-22 LAB — ECHOCARDIOGRAM COMPLETE
Area-P 1/2: 3.65 cm2
MV M vel: 4.92 m/s
MV Peak grad: 96.6 mmHg
P 1/2 time: 430 ms
Radius: 0.53 cm
S' Lateral: 2.4 cm

## 2024-02-23 ENCOUNTER — Ambulatory Visit (HOSPITAL_COMMUNITY)
Admission: RE | Admit: 2024-02-23 | Discharge: 2024-02-23 | Disposition: A | Discharge status: Home / Self Care | Payer: 59 | Source: Ambulatory Visit | Attending: Cardiology | Admitting: Cardiology

## 2024-02-23 DIAGNOSIS — I728 Aneurysm of other specified arteries: Secondary | ICD-10-CM | POA: Diagnosis present

## 2024-02-26 ENCOUNTER — Ambulatory Visit: Payer: Self-pay | Admitting: Cardiology

## 2024-02-26 DIAGNOSIS — I77810 Thoracic aortic ectasia: Secondary | ICD-10-CM

## 2024-02-26 DIAGNOSIS — I728 Aneurysm of other specified arteries: Secondary | ICD-10-CM

## 2024-02-26 NOTE — Progress Notes (Signed)
 I agree.  However, in addition, I would recommend CTA aorta for correlation.  We talked about vascular surgery referral after her splenic artery aneurysm finding in 04/2023, but I do not think that ever happened.  Even though it is stable, recommend vascular surgery consultation regarding the same.  Thanks MJP

## 2024-03-01 NOTE — Progress Notes (Signed)
 Orders placed.

## 2024-04-03 NOTE — Progress Notes (Unsigned)
 Office Note     CC:  1.3cm splenic artery aneurysm  Requesting Provider:  Debrah Josette ORN., PA-C  HPI: Rachel Mccarty is a 63 y.o. 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  On exam, Ameila was doing well.  In active [employer], she graduated from [employer].  She has 1 daughter, and 2 grandchildren whom she is excited to spend Christmas with. T`he aneurysm was found incidentally during shortness of breath workup in which a PE was diagnosed.  She is on anticoagulation for unprovoked DVT.  Prior to the workup, she was unaware of the splenic artery aneurysm.  The aneurysm was found to be 1.3 cm, calcific.  She is postmenopausal.  No history of liver transplant. Denies abdominal pain, chest pain, back pain.   Past Medical History:  Diagnosis Date   Acid reflux    Hyperlipidemia    Hypertension    Palpitations     Past Surgical History:  Procedure Laterality Date   COLONOSCOPY  2013   OVARIAN CYST SURGERY     UPPER GI ENDOSCOPY  2018    Social History   Socioeconomic History   Marital status: Legally Separated    Spouse name: Not on file   Number of children: 2   Years of education: Not on file   Highest education level: Not on file  Occupational History   Not on file  Tobacco Use   Smoking status: Never   Smokeless tobacco: Never  Vaping Use   Vaping status: Never Used  Substance and Sexual Activity   Alcohol use: Yes    Alcohol/week: 1.0 standard drink of alcohol    Types: 1 Cans of beer per week    Comment: 1 drink per day   Drug use: Never   Sexual activity: Not on file  Other Topics Concern   Not on file  Social History Narrative   Not on file   Social Drivers of Health   Tobacco Use: Low Risk (08/29/2023)   [facility]   Patient History    Smoking Tobacco Use: Never    Smokeless Tobacco Use: Never    Passive Exposure: Not on file  Financial Resource Strain:  Not on file  Food Insecurity: Low Risk (08/07/2023)   [facility]   Epic    Within the past 12 months, you worried that your food would run out before you got money to buy more: Never true    Within the past 12 months, the food you bought just didn't last and you didn't have money to get more. : Never true  Transportation Needs: No Transportation Needs (08/07/2023)   Received from [facility]    In the past 12 months, has lack of reliable transportation kept you from medical appointments, meetings, work or from getting things needed for daily living? : No  Physical Activity: Not on file  Stress: Not on file  Social Connections: Not on file  Intimate Partner Violence: Not At Risk (02/15/2023)   Humiliation, Afraid, Rape, and Kick questionnaire    Fear of Current or Ex-Partner: No    Emotionally Abused: No    Physically Abused: No    Sexually Abused: No  Depression (PHQ2-9): Not on file  Alcohol Screen: Not on file  Housing: Low Risk (08/07/2023)   Received from [facility]   Epic    What is your living situation today?: I have  a steady place to live    Think about the place you live. Do you have problems with any of the following? Choose all that apply:: None/None on this list  Utilities: Low Risk (08/07/2023)   Received from [facility]   Utilities    In the past 12 months has the electric, gas, oil, or water company threatened to shut off services in your home? : No  Health Literacy: Not on file   Family History  Problem Relation Age of Onset   Sudden Cardiac Death Mother    Dementia Mother    High blood pressure Father    Heart attack Maternal Grandmother    Stroke Maternal Grandfather    Colon cancer Neg Hx    Pancreatic cancer Neg Hx    Esophageal cancer Neg Hx    Liver disease Neg Hx    Stomach cancer Neg Hx     Current Outpatient Medications  Medication Sig Dispense Refill   ALPRAZolam  (XANAX ) 0.25 MG tablet Take 0.25 mg by  mouth as needed for anxiety.     apixaban  (ELIQUIS ) 5 MG TABS tablet Take 1 tablet (5 mg total) by mouth 2 (two) times daily. Save this prescription to fill later.  Take this once you've completed your eliquis  starter pack. 60 tablet 1   atenolol  (TENORMIN ) 50 MG tablet TAKE 1 TABLET BY MOUTH EVERY DAY 90 tablet 1   diltiazem  (CARDIZEM  CD) 120 MG 24 hr capsule Take 120 mg by mouth daily.     fluticasone  (FLONASE ) 50 MCG/ACT nasal spray Place 2 sprays into both nostrils daily as needed for allergies.     Multiple Vitamin tablet Take 2 tablets by mouth at bedtime.     omeprazole (PRILOSEC) 40 MG capsule Take 40 mg by mouth daily.     verapamil  (CALAN -SR) 120 MG CR tablet Take 1 tablet (120 mg total) by mouth daily. (Patient not taking: Reported on 07/26/2023) 90 tablet 1   No current facility-administered medications for this visit.    Allergies[1]   REVIEW OF SYSTEMS:  [X]  denotes positive finding, [ ]  denotes negative finding Cardiac  Comments:  Chest pain or chest pressure:    Shortness of breath upon exertion:    Short of breath when lying flat:    Irregular heart rhythm:        Vascular    Pain in calf, thigh, or hip brought on by ambulation:    Pain in feet at night that wakes you up from your sleep:     Blood clot in your veins:    Leg swelling:         Pulmonary    Oxygen at home:    Productive cough:     Wheezing:         Neurologic    Sudden weakness in arms or legs:     Sudden numbness in arms or legs:     Sudden onset of difficulty speaking or slurred speech:    Temporary loss of vision in one eye:     Problems with dizziness:         Gastrointestinal    Blood in stool:     Vomited blood:         Genitourinary    Burning when urinating:     Blood in urine:        Psychiatric    Major depression:         Hematologic    Bleeding problems:    Problems  with blood clotting too easily:        Skin    Rashes or ulcers:        Constitutional    Fever or  chills:      PHYSICAL EXAMINATION:  There were no vitals filed for this visit.  General:  WDWN in NAD; vital signs documented above Gait: Not observed HENT: WNL, normocephalic Pulmonary: normal non-labored breathing , without wheezing Cardiac: regular HR Abdomen: soft, NT, no masses Skin: without rashes Vascular Exam/Pulses:  Right Left  Radial 2+ (normal) 2+ (normal)  Ulnar    Femoral    Popliteal    DP    PT     Extremities: without ischemic changes, without Gangrene , without cellulitis; without open wounds;  Musculoskeletal: no muscle wasting or atrophy  Neurologic: A&O X 3;  No focal weakness or paresthesias are detected Psychiatric:  The pt has Normal affect.   Non-Invasive Vascular Imaging:    1.3 cm peripherally calcified splenic artery aneurysm.     ASSESSMENT/PLAN: ILLONA BULMAN is a 63 y.o. 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  Aneurysm is unchanged over the last year.  The CT scan from 2024 was evaluated as well as recent mesenteric ultrasound.  I had a long conversation with Aubriel regarding the above.  She has no history of liver transplant, and is now postmenopausal.  We can continue to watch the aneurysm with yearly follow-up.  No plans to intervene unless the aneurysm enlarges to greater than 3 cm per SVS guidelines.   Fonda FORBES Rim, MD [facility] [phone]      [1]  Allergies Allergen Reactions   Cephalexin Diarrhea

## 2024-04-04 ENCOUNTER — Encounter: Payer: Self-pay | Admitting: Vascular Surgery

## 2024-04-04 ENCOUNTER — Ambulatory Visit: Attending: Vascular Surgery | Admitting: Vascular Surgery

## 2024-04-04 VITALS — BP 128/89 | HR 66 | Temp 97.8°F | Resp 16 | Ht 65.0 in | Wt 172.6 lb

## 2024-04-04 DIAGNOSIS — I728 Aneurysm of other specified arteries: Secondary | ICD-10-CM
